# Patient Record
Sex: Male | Born: 1960 | Race: Black or African American | Hispanic: No | Marital: Married | Smoking: Former smoker
Health system: Southern US, Community
[De-identification: ages and names within clinical notes are randomized; demographics above are authoritative.]

## PROBLEM LIST (undated history)

## (undated) DIAGNOSIS — I2699 Other pulmonary embolism without acute cor pulmonale: Secondary | ICD-10-CM

## (undated) DIAGNOSIS — F909 Attention-deficit hyperactivity disorder, unspecified type: Secondary | ICD-10-CM

## (undated) DIAGNOSIS — J189 Pneumonia, unspecified organism: Secondary | ICD-10-CM

## (undated) DIAGNOSIS — K219 Gastro-esophageal reflux disease without esophagitis: Secondary | ICD-10-CM

## (undated) DIAGNOSIS — I10 Essential (primary) hypertension: Secondary | ICD-10-CM

## (undated) HISTORY — PX: CHEST TUBE INSERTION: SHX231

---

## 2007-05-11 ENCOUNTER — Emergency Department (HOSPITAL_COMMUNITY): Admission: EM | Admit: 2007-05-11 | Discharge: 2007-05-11 | Payer: Self-pay | Admitting: Emergency Medicine

## 2008-03-12 ENCOUNTER — Ambulatory Visit: Payer: Self-pay | Admitting: Radiology

## 2008-03-12 ENCOUNTER — Emergency Department (HOSPITAL_BASED_OUTPATIENT_CLINIC_OR_DEPARTMENT_OTHER): Admission: EM | Admit: 2008-03-12 | Discharge: 2008-03-12 | Payer: Self-pay | Admitting: Emergency Medicine

## 2008-04-25 ENCOUNTER — Encounter: Payer: Self-pay | Admitting: Emergency Medicine

## 2008-04-25 ENCOUNTER — Observation Stay (HOSPITAL_COMMUNITY): Admission: EM | Admit: 2008-04-25 | Discharge: 2008-04-27 | Payer: Self-pay | Admitting: Cardiovascular Disease

## 2008-04-25 ENCOUNTER — Ambulatory Visit: Payer: Self-pay | Admitting: Diagnostic Radiology

## 2009-02-18 ENCOUNTER — Ambulatory Visit: Payer: Self-pay | Admitting: Diagnostic Radiology

## 2009-02-18 ENCOUNTER — Emergency Department (HOSPITAL_BASED_OUTPATIENT_CLINIC_OR_DEPARTMENT_OTHER): Admission: EM | Admit: 2009-02-18 | Discharge: 2009-02-18 | Payer: Self-pay | Admitting: Emergency Medicine

## 2010-04-25 LAB — BASIC METABOLIC PANEL
BUN: 8 mg/dL (ref 6–23)
BUN: 9 mg/dL (ref 6–23)
CO2: 26 mEq/L (ref 19–32)
Calcium: 8.8 mg/dL (ref 8.4–10.5)
Calcium: 8.9 mg/dL (ref 8.4–10.5)
Chloride: 106 mEq/L (ref 96–112)
Chloride: 108 mEq/L (ref 96–112)
GFR calc Af Amer: >60 mL/min (ref >60)
GFR calc non Af Amer: >60 mL/min (ref >60)
Sodium: 143 mEq/L (ref 135–145)

## 2010-04-25 LAB — CBC
Hemoglobin: 10.6 g/dL — ABNORMAL LOW (ref 13.0–17.0)
Hemoglobin: 11.5 g/dL — ABNORMAL LOW (ref 13.0–17.0)
MCHC: 33.5 g/dL (ref 30.0–36.0)
MCHC: 33.8 g/dL (ref 30.0–36.0)
MCV: 101.5 fL — ABNORMAL HIGH (ref 78.0–100.0)
MCV: 101.5 fL — ABNORMAL HIGH (ref 78.0–100.0)
MCV: 102.3 fL — ABNORMAL HIGH (ref 78.0–100.0)
RBC: 3.1 MIL/uL — ABNORMAL LOW (ref 4.22–5.81)
RBC: 3.17 MIL/uL — ABNORMAL LOW (ref 4.22–5.81)
RBC: 3.35 MIL/uL — ABNORMAL LOW (ref 4.22–5.81)
RDW: 13.5 % (ref 11.5–15.5)

## 2010-04-25 LAB — D-DIMER, QUANTITATIVE: D-Dimer, Quant: <0.22 ug/mL-FEU (ref 0.00–0.48)

## 2010-04-25 LAB — CROSSMATCH

## 2010-04-25 LAB — LIPID PANEL
Cholesterol: 144 mg/dL (ref 0–200)
HDL: 30 mg/dL — ABNORMAL LOW (ref >39)
LDL Cholesterol: 106 mg/dL — ABNORMAL HIGH (ref 0–99)
Total CHOL/HDL Ratio: 4.8 RATIO
Triglycerides: 39 mg/dL (ref <150)
VLDL: 8 mg/dL (ref 0–40)

## 2010-04-25 LAB — CARDIAC PANEL(CRET KIN+CKTOT+MB+TROPI)
CK, MB: 1.7 ng/mL (ref 0.3–4.0)
CK, MB: 1.7 ng/mL (ref 0.3–4.0)
CK, MB: 2.1 ng/mL (ref 0.3–4.0)
Total CK: 257 U/L — ABNORMAL HIGH (ref 7–232)
Troponin I: <0.01 ng/mL (ref 0.00–0.06)

## 2010-04-25 LAB — PROTIME-INR
INR: 3 — ABNORMAL HIGH (ref 0.00–1.49)
Prothrombin Time: 28.9 seconds — ABNORMAL HIGH (ref 11.6–15.2)
Prothrombin Time: 33.3 seconds — ABNORMAL HIGH (ref 11.6–15.2)

## 2010-04-25 LAB — POCT CARDIAC MARKERS
CKMB, poc: 1.4 ng/mL (ref 1.0–8.0)
Troponin i, poc: <0.05 ng/mL (ref 0.00–0.09)

## 2010-05-01 LAB — CBC
Hemoglobin: 14.2 g/dL (ref 13.0–17.0)
MCHC: 33.2 g/dL (ref 30.0–36.0)
RDW: 12.7 % (ref 11.5–15.5)

## 2010-05-01 LAB — URINALYSIS, ROUTINE W REFLEX MICROSCOPIC
Hgb urine dipstick: NEGATIVE
Nitrite: NEGATIVE
Protein, ur: NEGATIVE mg/dL
Specific Gravity, Urine: 1.01 (ref 1.005–1.030)
Urobilinogen, UA: 1 mg/dL (ref 0.0–1.0)

## 2010-05-01 LAB — COMPREHENSIVE METABOLIC PANEL
ALT: 14 U/L (ref 0–53)
Calcium: 9.7 mg/dL (ref 8.4–10.5)
Creatinine, Ser: 0.9 mg/dL (ref 0.4–1.5)
GFR calc Af Amer: >60 mL/min (ref >60)
Glucose, Bld: 118 mg/dL — ABNORMAL HIGH (ref 70–99)
Sodium: 144 mEq/L (ref 135–145)
Total Protein: 9.7 g/dL — ABNORMAL HIGH (ref 6.0–8.3)

## 2010-05-01 LAB — DIFFERENTIAL
Eosinophils Absolute: 0.1 10*3/uL (ref 0.0–0.7)
Lymphocytes Relative: 5 % — ABNORMAL LOW (ref 12–46)
Lymphs Abs: 0.5 10*3/uL — ABNORMAL LOW (ref 0.7–4.0)
Monocytes Relative: 3 % (ref 3–12)
Neutrophils Relative %: 90 % — ABNORMAL HIGH (ref 43–77)

## 2010-05-01 LAB — PROTIME-INR
INR: 1.8 — ABNORMAL HIGH (ref 0.00–1.49)
Prothrombin Time: 21.5 seconds — ABNORMAL HIGH (ref 11.6–15.2)

## 2010-05-29 NOTE — Discharge Summary (Signed)
[name], Bailey NO.:  [id number]   MEDICAL RECORD NO.:  [id number]          PATIENT TYPE:  INP   LOCATION:  3743                         FACILITY:  [facility]   PHYSICIAN:  Thereasa Solo. Little, M.D. DATE OF BIRTH:  01-Jun-1960   DATE OF ADMISSION:  04/25/2008  DATE OF DISCHARGE:  04/27/2008                               DISCHARGE SUMMARY   DISCHARGE DIAGNOSES:  1. Chest pain, probably pericarditis.  2. Status post cardiac CT angio with no evidence of coronary artery      disease.  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  3. 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  4. History of lupus anticoagulant.  5. 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  6. Dyslipidemia.  7. History of gastrointestinal bleed.  8. History of inferior vena cava filter.  9. History of pneumothorax in 1990.  10.History of surgical repair of his blebs.  11.Hypertension.  12.Hyperlipidemia.  13.Sinus bradycardia, rate here while sleeping is 32-37, the patient      is asymptomatic.  When he gets up and walks in the hall, it does      increase to 60.  14.Anemia, worked up in [employer] by his hematologist, Dr. Allyne Gee.   LABORATORY DATA:  INR on April 27, 2008, was 3.0; INR on April 26, 2008,  was 2.2; and INR on April 25, 2008, was 2.5.  Cardiac marker x1 is  troponin less than 0.05.  CK-MB 1.4.  CK-MB:  1. 294/2.1, troponin of less than 0.01.  2. 281/1.7, troponin of less than 0.01.  3. 257/1.7, troponin of less than 0.01.  4. 281/1.7, troponin of less than 0.01.   TSH 1.125.  Hemoglobin A1c 5.1.  Total cholesterol 144, triglycerides  39, HDL 30, and LDL was 106.  Magnesium was 1.9.  D-dimer was less than  0.22.  Hemoglobin 10.6, hematocrit 31.4, WBC 6.8, and platelets 245.  Sodium 141, potassium 3.9, BUN 8, creatinine 0.84.   X-rays, April 25, 2008:  Chest x-ray shows borderline cardiomegaly, no  acute abnormality.  CT of his chest, I do not have  the full report, but  the radiologist's read states emphysema with bullous changes, no left  hemothorax, bilateral lower lobe atelectasis and/or scarring.  Preliminary report read by Dr. Mariah Milling is that he has calcium score of 0,  no significant CAD.  He does have atherosclerosis in his descending  aorta.   HOSPITAL COURSE:  [name] is a 50 year old African American  who received his care at [facility] in [facility].  His primary is Dr.  Almedia Balls.  His hematologist is Dr. Ivor Reining.  He went to Med  [facility] in [facility] because of chest pain.  He had stated that it  started on Saturday while attending an anniversary celebration for his  parents.  He then had a fleeting left anterior chest pain, sharp  intermittently during the afternoon.  He had no associated shortness of  breath, nausea, vomiting, or diaphoresis.  He had a few pains on Sunday,  but not severe.  They were more fleeting in  the night on admission,  April 25, 2008.  He went outside on his porch, he worked on his rowing  machine for 10-15 minutes, and he smoked a cigarette, and began to  experience chest pain.  He went to the [facility] at [facility].  He had  a sinus brady with repol abnormality and he was brought to [facility].  His troponin had by that time negative x2.  He was admitted.  We  continued his Coumadin.  His INR was therapeutic.  It was decided that  he should undergo cardiac CT angio.  This was performed.  Dr. Mariah Milling did  the Cardiology read.  His calcium score was negative.  He had no  significant CAD.  He did have atherosclerosis in his descending aorta.  He had emphysema of his lungs.  He was then to be discharged later in  the day, but he continued to have pain.  He was treated initially with  morphine and then we tried Toradol and Solu-Medrol.  These did not seem  to be effective and on April 26, 2008, we did place him on indomethacin  25 mg t.i.d.  He did receive morphine in the night  on April 26, 2008,  but then by April 27, 2008, his last morphine was at 4:30 a.m. and at  the time of discharge on 3 o'clock he had been pain free.  He was seen  by Dr. Clarene Duke.  His blood pressure was 118/80.  His heart rate was slow  at 32-36.  He got up and walked in the hall, and his heart rate  appropriately went up in the 60s.  He was afebrile, temperature 97.4.  His INR was 3.0.  His O2 sats were 97% on room air.  During his  hospitalization, his blood pressure was not low.  We did discontinue his  hydralazine, and we cutback on his Norvasc.  He was told to follow up  with Dr. Mariah Milling in about 1 week's time and he should follow up with his  primary care doctor.  He was asked to have his protime checked on  Friday.  Because his INR jumped from 2.2 to 3.0 with the addition of  indomethacin, he was asked just to take his warfarin 5 mg on Wednesday  night and Thursday night, and have his INR checked on Friday.  He had  been receiving his usual dose in the hospital, which was 10 mg every day  except 5 mg on Wednesday.  Current discharge medications are benazepril  20 mg a day, amlodipine 5 mg a day, warfarin 5 mg Wednesday and  Thursday.  Have his protime checked on Friday.  Protonix 40 mg twice a  day, indomethacin 25 mg two times per day with food x10 days, hold his  aspirin for now.  He may use nicotine patches to quit smoking.  Simvastatin 40 mg daily at bedtime.      Lezlie Octave, N.P.    ______________________________  Thereasa Solo. Little, M.D.    BB/MEDQ  D:  04/27/2008  T:  04/28/2008  Job:  [id number]   cc:   Zenaida Deed MD Allyne Gee

## 2010-05-29 NOTE — Discharge Summary (Signed)
NAMEKIRIN, PASTORINO NO.:  [id number]   MEDICAL RECORD NO.:  [id number]          PATIENT TYPE:  EMS   LOCATION:  ED                            FACILITY:  [facility]   PHYSICIAN:  Antonieta Iba, MD   DATE OF BIRTH:  Jun 20, 1960   DATE OF ADMISSION:  04/25/2008  DATE OF DISCHARGE:  04/25/2008                               DISCHARGE SUMMARY   DISCHARGE DIAGNOSES:  1. Chest pain, probably noncardiac.  2. History of lupus anticoagulant and pulmonary embolism in the past      status post [place] filter and on chronic Coumadin therapy,      negative D-dimer this admission.  3. Treated hypertension.  4. Treated dyslipidemia.   HOSPITAL COURSE:  The patient is a 50 year old African American male who  was transferred from [facility] in [facility] where he presented  with chest pain.  He says his symptoms started Saturday prior to  admission.  He developed some left upper anterior chest pain which was  intermittent.  He admits to having an upper respiratory infection a  couple of weeks ago with some fairly severe bouts of coughing, he  relates this to pollen allergy.  He was transferred from [facility]  to [facility].  The patient has been on Coumadin although he admits that about  a week or so ago he stopped taking it but has resumed it.  His INR was  therapeutic on admission.  His D-dimer was negative.  He is admitted to  telemetry.  CK-MB and troponins were obtained and were negative.  He was  continued on Coumadin.  He underwent a cardiac CT scan which was  negative for significant coronary artery disease per Dr. Mariah Milling.  There  was evidence of COPD on CT scan.  The patient was seen by a tobacco  cessation counselor.  We feel he can be discharged later on April 25, 2008.  We will go ahead and give him a short course of nonsteroidal anti-  inflammatories for presumed musculoskeletal chest pain.   DISCHARGE MEDICATIONS:  1. Celebrex 200 mg b.i.d. for 5 days.  2.  Coumadin 5 mg Wednesday and 10 mg other days.  3. Lotrel 10/20 daily.  4. Hydralazine 50 mg t.i.d. as taken at home.  5. Simvastatin 40 mg a day.  6. Protonix 40 mg a day.  7. Aspirin 325 mg a day.  8. Nicotine patch 21 mg to start as directed.   LABORATORY DATA:  White count 5.6, hemoglobin 10.9, hematocrit 32.5,  platelets 264.  Sodium 141, potassium 3.9, BUN 8, creatinine 0.84,  hemoglobin A1c is 5.1.  CKs were elevated at 294 and 281 but MB and  troponins were negative.  Lipid panel shows a cholesterol of 144,  triglycerides of 39, HDL 30, LDL 106.  TSH 1.12.  Blood type is O+.  Cardiac CT initial reading shows emphysema with bullous changes in the  left hemithorax with  bilateral lower lobe atelectasis and scarring, followup CT reading per  Dr. Mariah Milling showed no significant coronary artery disease.   DISPOSITION:  The patient is  discharged in stable condition.  He will  follow up with Dr. Tresa Endo in [facility].      Abelino Derrick, P.A.      Antonieta Iba, MD  Electronically Signed    LKK/MEDQ  D:  04/25/2008  T:  04/26/2008  Job:  [id number]   cc:   Shaune Spittle, MD

## 2010-06-28 ENCOUNTER — Emergency Department (HOSPITAL_BASED_OUTPATIENT_CLINIC_OR_DEPARTMENT_OTHER)
Admission: EM | Admit: 2010-06-28 | Discharge: 2010-06-28 | Disposition: A | Discharge status: Other Acute Inpatient Hospital | Payer: 59 | Attending: Emergency Medicine | Admitting: Emergency Medicine

## 2010-06-28 ENCOUNTER — Emergency Department (INDEPENDENT_AMBULATORY_CARE_PROVIDER_SITE_OTHER): Payer: 59

## 2010-06-28 DIAGNOSIS — I517 Cardiomegaly: Secondary | ICD-10-CM

## 2010-06-28 DIAGNOSIS — Z79899 Other long term (current) drug therapy: Secondary | ICD-10-CM | POA: Insufficient documentation

## 2010-06-28 DIAGNOSIS — I10 Essential (primary) hypertension: Secondary | ICD-10-CM | POA: Insufficient documentation

## 2010-06-28 DIAGNOSIS — M25519 Pain in unspecified shoulder: Secondary | ICD-10-CM

## 2010-06-28 DIAGNOSIS — F17200 Nicotine dependence, unspecified, uncomplicated: Secondary | ICD-10-CM | POA: Insufficient documentation

## 2010-06-28 DIAGNOSIS — R079 Chest pain, unspecified: Secondary | ICD-10-CM | POA: Insufficient documentation

## 2010-06-28 DIAGNOSIS — R0602 Shortness of breath: Secondary | ICD-10-CM

## 2010-06-28 DIAGNOSIS — E7800 Pure hypercholesterolemia, unspecified: Secondary | ICD-10-CM | POA: Insufficient documentation

## 2010-06-28 LAB — DIFFERENTIAL
Basophils Absolute: 0 10*3/uL (ref 0.0–0.1)
Basophils Relative: 0 % (ref 0–1)
Eosinophils Absolute: 0.1 10*3/uL (ref 0.0–0.7)
Eosinophils Relative: 1 % (ref 0–5)
Monocytes Absolute: 0.6 10*3/uL (ref 0.1–1.0)
Monocytes Relative: 6 % (ref 3–12)

## 2010-06-28 LAB — URINALYSIS, ROUTINE W REFLEX MICROSCOPIC
Bilirubin Urine: NEGATIVE
Glucose, UA: NEGATIVE mg/dL
Hgb urine dipstick: NEGATIVE
Specific Gravity, Urine: 1.011 (ref 1.005–1.030)
Urobilinogen, UA: 1 mg/dL (ref 0.0–1.0)

## 2010-06-28 LAB — CBC
MCH: 33.2 pg (ref 26.0–34.0)
MCHC: 33.5 g/dL (ref 30.0–36.0)
RDW: 12.2 % (ref 11.5–15.5)

## 2010-06-28 LAB — BASIC METABOLIC PANEL
BUN: 22 mg/dL (ref 6–23)
Calcium: 9.4 mg/dL (ref 8.4–10.5)
GFR calc Af Amer: >60 mL/min (ref >60)
GFR calc non Af Amer: >60 mL/min (ref >60)
Glucose, Bld: 108 mg/dL — ABNORMAL HIGH (ref 70–99)
Potassium: 3.7 mEq/L (ref 3.5–5.1)
Sodium: 136 mEq/L (ref 135–145)

## 2010-06-28 LAB — PROTIME-INR
INR: 2.09 — ABNORMAL HIGH (ref 0.00–1.49)
Prothrombin Time: 23.8 seconds — ABNORMAL HIGH (ref 11.6–15.2)

## 2010-06-28 LAB — CK TOTAL AND CKMB (NOT AT ARMC)
CK, MB: 2.8 ng/mL (ref 0.3–4.0)
Relative Index: 0.9 (ref 0.0–2.5)

## 2010-10-09 LAB — POCT I-STAT, CHEM 8
BUN: 13
Calcium, Ion: 1.11 — ABNORMAL LOW
Chloride: 106
Creatinine, Ser: 1.2
Glucose, Bld: 99
TCO2: 25

## 2010-10-09 LAB — CBC
HCT: 35.6 — ABNORMAL LOW
MCHC: 33.7
MCV: 100.4 — ABNORMAL HIGH
RBC: 3.55 — ABNORMAL LOW
WBC: 7.1

## 2010-10-09 LAB — DIFFERENTIAL
Basophils Relative: 1
Eosinophils Absolute: 0.2
Eosinophils Relative: 3
Lymphs Abs: 3
Monocytes Absolute: 0.4
Monocytes Relative: 6

## 2013-05-24 ENCOUNTER — Encounter (HOSPITAL_BASED_OUTPATIENT_CLINIC_OR_DEPARTMENT_OTHER): Payer: Self-pay | Admitting: Emergency Medicine

## 2013-05-24 DIAGNOSIS — R079 Chest pain, unspecified: Secondary | ICD-10-CM | POA: Insufficient documentation

## 2013-05-24 NOTE — ED Notes (Signed)
Pt reports L sided chest pain today intermittently.  Denies sob, nausea or sweating but states that the last episode 'took my breath away.'  Reports 'feels like i pulled a muscle but i keep having some sharp stabbing pains.

## 2013-05-25 ENCOUNTER — Emergency Department (HOSPITAL_BASED_OUTPATIENT_CLINIC_OR_DEPARTMENT_OTHER)
Admission: EM | Admit: 2013-05-25 | Discharge: 2013-05-25 | Discharge status: Against Medical Advice | Payer: 59 | Attending: Emergency Medicine | Admitting: Emergency Medicine

## 2013-05-25 HISTORY — DX: Attention-deficit hyperactivity disorder, unspecified type: F90.9

## 2013-05-25 HISTORY — DX: Essential (primary) hypertension: I10

## 2013-05-25 HISTORY — DX: Gastro-esophageal reflux disease without esophagitis: K21.9

## 2013-05-25 NOTE — ED Notes (Signed)
rec'd call from registration stating that pt had LWBS

## 2018-07-16 ENCOUNTER — Emergency Department (HOSPITAL_BASED_OUTPATIENT_CLINIC_OR_DEPARTMENT_OTHER)
Admission: EM | Admit: 2018-07-16 | Discharge: 2018-07-16 | Disposition: A | Discharge status: Home / Self Care | Payer: Self-pay | Attending: Emergency Medicine | Admitting: Emergency Medicine

## 2018-07-16 ENCOUNTER — Emergency Department (HOSPITAL_BASED_OUTPATIENT_CLINIC_OR_DEPARTMENT_OTHER): Payer: Self-pay

## 2018-07-16 ENCOUNTER — Encounter (HOSPITAL_BASED_OUTPATIENT_CLINIC_OR_DEPARTMENT_OTHER): Payer: Self-pay

## 2018-07-16 ENCOUNTER — Other Ambulatory Visit: Payer: Self-pay

## 2018-07-16 DIAGNOSIS — Z79899 Other long term (current) drug therapy: Secondary | ICD-10-CM | POA: Insufficient documentation

## 2018-07-16 DIAGNOSIS — I10 Essential (primary) hypertension: Secondary | ICD-10-CM | POA: Insufficient documentation

## 2018-07-16 DIAGNOSIS — G8929 Other chronic pain: Secondary | ICD-10-CM

## 2018-07-16 DIAGNOSIS — Y9389 Activity, other specified: Secondary | ICD-10-CM | POA: Insufficient documentation

## 2018-07-16 DIAGNOSIS — F17210 Nicotine dependence, cigarettes, uncomplicated: Secondary | ICD-10-CM | POA: Insufficient documentation

## 2018-07-16 DIAGNOSIS — M7021 Olecranon bursitis, right elbow: Secondary | ICD-10-CM | POA: Insufficient documentation

## 2018-07-16 DIAGNOSIS — F909 Attention-deficit hyperactivity disorder, unspecified type: Secondary | ICD-10-CM | POA: Insufficient documentation

## 2018-07-16 DIAGNOSIS — M25511 Pain in right shoulder: Secondary | ICD-10-CM | POA: Insufficient documentation

## 2018-07-16 HISTORY — DX: Pneumonia, unspecified organism: J18.9

## 2018-07-16 HISTORY — DX: Other pulmonary embolism without acute cor pulmonale: I26.99

## 2018-07-16 LAB — BASIC METABOLIC PANEL
Anion gap: 8 (ref 5–15)
BUN: 8 mg/dL (ref 6–20)
CO2: 27 mmol/L (ref 22–32)
Calcium: 9.3 mg/dL (ref 8.9–10.3)
Chloride: 106 mmol/L (ref 98–111)
Creatinine, Ser: 0.88 mg/dL (ref 0.61–1.24)
GFR calc Af Amer: >60 mL/min (ref >60)
GFR calc non Af Amer: >60 mL/min (ref >60)
Glucose, Bld: 95 mg/dL (ref 70–99)
Potassium: 4 mmol/L (ref 3.5–5.1)
Sodium: 141 mmol/L (ref 135–145)

## 2018-07-16 MED ORDER — METHOCARBAMOL 500 MG PO TABS: 500.0000 mg | ORAL_TABLET | Freq: Two times a day (BID) | ORAL | 0 refills | Status: DC

## 2018-07-16 MED ORDER — NAPROXEN 500 MG PO TABS: 500.0000 mg | ORAL_TABLET | Freq: Two times a day (BID) | ORAL | 0 refills | Status: DC

## 2018-07-16 MED FILL — METHOCARBAMOL 500 MG TABLET: 500 | 10 days supply | Qty: 20 | Fill #0

## 2018-07-16 MED FILL — NAPROXEN 500 MG TABLET: 500 | 5 days supply | Qty: 10 | Fill #0

## 2018-07-16 NOTE — ED Provider Notes (Signed)
[facility] EMERGENCY DEPARTMENT Provider Note   CSN: [id number] Arrival date & time: 07/16/18  1225    History   Chief Complaint Chief Complaint  Patient presents with  . Joint Swelling    HPI Darrell Bailey is a 58 y.o. male who presents for evaluation of two complaints. He reports that for about a week he has had some swelling to the right lateral elbow. He has noted any overlying warmth, erythema. He denies any preceding trauma, injury or fall. He denies any overuse injury but does state that he was out pushing the lawnmower the day prior to onset of symptoms.  He has not been taking any medications or tried any treatments at home for symptoms.  He does report that he feels like the swelling is getting bigger.  He has not had any decreased range of motion, fevers, chills, numbness/weakness.  Additionally, he also comes in for evaluation of right shoulder pain that is been ongoing for a while.  He states he has had issues with his right shoulder for last 2 years.  He has not sought evaluation for it.  He feels like in the last month or so, the pain was becoming more bothersome which was affecting the range of motion of his right shoulder.  Patient denies any preceding trauma, injury, fall.  He has not noticed any overlying warmth, erythema, edema.  He has not noted any fevers or chills.  He states that the pain is worse when he attempts to raise his arm up over his head.     The history is provided by the patient.    Past Medical History:  Diagnosis Date  . ADHD (attention deficit hyperactivity disorder)   . GERD (gastroesophageal reflux disease)   . Hypertension   . Pneumonia   . Pulmonary embolism (HCC)     There are no active problems to display for this patient.   Past Surgical History:  Procedure Laterality Date  . CHEST TUBE INSERTION          Home Medications    Prior to Admission medications   Medication Sig Start Date End Date Taking? Authorizing  Provider  amLODipine-benazepril (LOTREL) 10-20 MG per capsule Take 1 capsule by mouth daily.    [provider]  lisdexamfetamine (VYVANSE) 60 MG capsule Take 60 mg by mouth every morning.    [provider]  methocarbamol (ROBAXIN) 500 MG tablet Take 1 tablet (500 mg total) by mouth 2 (two) times daily. 07/16/18   Maxwell CaulLayden,  A, PA-C  naproxen (NAPROSYN) 500 MG tablet Take 1 tablet (500 mg total) by mouth 2 (two) times daily. 07/16/18   Maxwell CaulLayden,  A, PA-C  pantoprazole (PROTONIX) 40 MG tablet Take 40 mg by mouth daily.    [provider]    Family History No family history on file.  Social History Social History   Tobacco Use  . Smoking status: Current Every Day Smoker    Packs/day: 0.50    Types: Cigarettes  . Smokeless tobacco: Never Used  Substance Use Topics  . Alcohol use: Yes    Comment: occ  . Drug use: Yes    Types: Marijuana     Allergies   Patient has no known allergies.   Review of Systems Review of Systems  Constitutional: Negative for fever.  Gastrointestinal: Negative for abdominal pain, nausea and vomiting.  Musculoskeletal: Positive for joint swelling.       Right elbow pain Right shoulder pain  Skin: Negative  for color change.  Neurological: Negative for weakness, numbness and headaches.  All other systems reviewed and are negative.    Physical Exam Updated Vital Signs BP (!) 173/99 (BP Location: Right Arm)   Pulse (!) 50   Temp 99.1 F (37.3 C) (Oral)   Resp 18   Ht 6\' 1"  (1.854 m)   Wt 82.1 kg   SpO2 100%   BMI 23.88 kg/m   Physical Exam Vitals signs and nursing note reviewed.  Constitutional:      Appearance: He is well-developed.  HENT:     Head: Normocephalic and atraumatic.  Eyes:     General: No scleral icterus.       Right eye: No discharge.        Left eye: No discharge.     Conjunctiva/sclera: Conjunctivae normal.  Neck:     Comments: Positive Spurling's maneuver Cardiovascular:      Pulses:          Radial pulses are 2+ on the right side and 2+ on the left side.  Pulmonary:     Effort: Pulmonary effort is normal.  Musculoskeletal:     Right elbow: He exhibits swelling.     Comments: Swelling noted to the posterior aspect of the right elbow at the olecranon.  No overlying warmth, erythema.  Lection/tension of right elbow intact with any difficulty.  Flexion/tension of right wrist intact without any difficulty.  Diffuse tenderness palpation noted to right shoulder.  No deformity or crepitus noted.  No overlying warmth, erythema, edema.  Limited range of motion secondary to pain.  He can achieve about 110 degrees of flexion before experiencing pain.  Additionally, he can achieve about 100 degrees of abduction before experiencing pain.  Positive Neer's impingement, Hawkins, liftoff, empty can test.  No tenderness palpation of the left shoulder, left elbow, left wrist.  Full range of motion of left upper extremity without any difficulty.  Negative Neer's impingement, Hawkins, liftoff, empty can test.  Skin:    General: Skin is warm and dry.     Capillary Refill: Capillary refill takes less than 2 seconds.     Comments: Good distal cap refill. RUE is not dusky in appearance or cool to touch.  Neurological:     Mental Status: He is alert.     Comments: Sensation intact along major nerve distributions of BUE Equal grip strength bilatearlly  Psychiatric:        Speech: Speech normal.        Behavior: Behavior normal.      FROM of wrist, elbow Decreased ROM abduction and flex/extend of right shoulder Good 2+ radial pulses    ED Treatments / Results  Labs (all labs ordered are listed, but only abnormal results are displayed) Labs Reviewed  BASIC METABOLIC PANEL    EKG None  Radiology Dg Shoulder Right  Result Date: 07/16/2018 CLINICAL DATA:  Right shoulder pain for several months. EXAM: RIGHT SHOULDER - 2+ VIEW COMPARISON:  None. FINDINGS: There is no evidence of  fracture or dislocation. There is no evidence of arthropathy or other focal bone abnormality. Soft tissues are unremarkable. IMPRESSION: Negative. Electronically Signed   By: Lupita RaiderJames  Green Jr M.D.   On: 07/16/2018 13:54   Dg Elbow Complete Right  Result Date: 07/16/2018 CLINICAL DATA:  Acute right elbow pain and swelling. EXAM: RIGHT ELBOW - COMPLETE 3+ VIEW COMPARISON:  None. FINDINGS: There is no evidence of fracture, dislocation, or joint effusion. There is no evidence of arthropathy or  other focal bone abnormality. Soft tissue swelling is seen overlying the olecranon. IMPRESSION: No fracture or dislocation is noted. Soft tissue swelling is seen overlying the olecranon concerning for soft tissue infection or injury. Electronically Signed   By: Marijo Conception M.D.   On: 07/16/2018 13:56    Procedures Procedures (including critical care time)  Medications Ordered in ED Medications - No data to display   Initial Impression / Assessment and Plan / ED Course  I have reviewed the triage vital signs and the nursing notes.  Pertinent labs & imaging results that were available during my care of the patient were reviewed by me and considered in my medical decision making (see chart for details).  Clinical Course as of Jul 15 2049  Thu Jul 16, 2018  1413 DG Elbow Complete Right [ET]  1413 DG Elbow Complete Right [ET]    Clinical Course User Index [ET] Kandice Robinsons       58 year old male who presents for evaluation of right elbow swelling x1 week.  No overlying warmth, erythema.  Also endorses shoulder pain that is been ongoing for the last several years but worse in the last month.  No fevers. Patient is afebrile, non-toxic appearing, sitting comfortably on examination table. Vital signs reviewed and stable. Patient is neurovascularly intact.  On exam, he has swelling over the right olecranon with no overlying warmth, erythema.  Consistent with olecranon bursitis. Also  consider lipoma. History/physical exam not concerning for septic joint, DVT of upper extremity, acute arterial embolism/ischemic limb.  He has no overlying warmth or erythema that would be concerning for infectious etiology.  Additionally, right shoulder is without any deformity, any infectious signs.  Concern for cervical radiculopathy versus rotator cuff impingement.  Will plan for x-rays for evaluation of any acute bony abnormality.  Shoulder x-ray negative for any acute bony abnormality.  X-ray of elbow shows soft tissue swelling but no otherwise no acute bony abnormality.  Discussed results with patient.  I discussed treatment options here in the ED, including but not limited to conservative treatment as well as draining of olecranon bursa to help with symptomatic relief.  Patient declines wanting draining of olecranon bursa at this time.  I did discuss risk first benefits and he expresses understanding.  He wishes to decline and wants to try conservative treatment first.  Patient has not seen any outpatient orthopedic.  We will plan to provide him with outpatient Ortho referral.  Additionally, will plan to treat with conservative treatment, NSAID therapy.  BMP checked here in the ED which shows normal BUN and creatinine. At this time, patient exhibits no emergent life-threatening condition that require further evaluation in ED or admission. Patient had ample opportunity for questions and discussion. All patient's questions were answered with full understanding. Strict return precautions discussed. Patient expresses understanding and agreement to plan.   Portions of this note were generated with [employer]. Dictation errors may occur despite best attempts at proofreading.   Final Clinical Impressions(s) / ED Diagnoses   Final diagnoses:  Olecranon bursitis of right elbow  Chronic right shoulder pain    ED Discharge Orders         Ordered    methocarbamol (ROBAXIN) 500 MG tablet   2 times daily     07/16/18 1524    naproxen (NAPROSYN) 500 MG tablet  2 times daily     07/16/18 1524           , Tyna Jaksch, PA-C  07/16/18 2051    Melene PlanFloyd, Dan, DO 07/17/18 [id number]

## 2018-07-16 NOTE — ED Triage Notes (Signed)
C/o swelling to rigth elbow x 1 week-denies injury-also c/o pain to right shoulder x "years"-NAD-steady gait

## 2018-07-16 NOTE — Discharge Instructions (Signed)
Take naproxen as directed.  As we discussed, you can apply ice to the area to help with pain and swelling.  Take Robaxin as prescribed. This medication will make you drowsy so do not drive or drink alcohol when taking it.  As we discussed, given shoulder complaints, please follow-up with referred orthopedic doctor.  I have also provided referral to [facility] for establishment of a primary care doctor.  Return to the emergency department for any fevers, redness or swelling of the elbow, difficulty moving the elbow, worsening pain or any other worsening or concerning symptoms.

## 2018-07-16 NOTE — ED Notes (Signed)
ED Provider at bedside. 

## 2019-06-10 ENCOUNTER — Other Ambulatory Visit: Payer: Self-pay

## 2019-06-10 ENCOUNTER — Emergency Department (HOSPITAL_COMMUNITY)
Admission: EM | Admit: 2019-06-10 | Discharge: 2019-06-11 | Disposition: A | Discharge status: Home / Self Care | Payer: Self-pay | Attending: Emergency Medicine | Admitting: Emergency Medicine

## 2019-06-10 ENCOUNTER — Encounter (HOSPITAL_COMMUNITY): Payer: Self-pay

## 2019-06-10 ENCOUNTER — Emergency Department (HOSPITAL_COMMUNITY): Payer: Self-pay

## 2019-06-10 DIAGNOSIS — N179 Acute kidney failure, unspecified: Secondary | ICD-10-CM | POA: Insufficient documentation

## 2019-06-10 DIAGNOSIS — R55 Syncope and collapse: Secondary | ICD-10-CM

## 2019-06-10 DIAGNOSIS — F17210 Nicotine dependence, cigarettes, uncomplicated: Secondary | ICD-10-CM | POA: Insufficient documentation

## 2019-06-10 DIAGNOSIS — Z86711 Personal history of pulmonary embolism: Secondary | ICD-10-CM | POA: Insufficient documentation

## 2019-06-10 DIAGNOSIS — E860 Dehydration: Secondary | ICD-10-CM | POA: Insufficient documentation

## 2019-06-10 LAB — BASIC METABOLIC PANEL
Anion gap: 11 (ref 5–15)
BUN: 16 mg/dL (ref 6–20)
CO2: 22 mmol/L (ref 22–32)
Calcium: 8.9 mg/dL (ref 8.9–10.3)
Chloride: 107 mmol/L (ref 98–111)
Creatinine, Ser: 1.62 mg/dL — ABNORMAL HIGH (ref 0.61–1.24)
GFR calc Af Amer: 53 mL/min — ABNORMAL LOW (ref >60)
GFR calc non Af Amer: 46 mL/min — ABNORMAL LOW (ref >60)
Glucose, Bld: 112 mg/dL — ABNORMAL HIGH (ref 70–99)
Potassium: 4.2 mmol/L (ref 3.5–5.1)
Sodium: 140 mmol/L (ref 135–145)

## 2019-06-10 LAB — CBC
HCT: 40.7 % (ref 39.0–52.0)
Hemoglobin: 13.2 g/dL (ref 13.0–17.0)
MCH: 32.8 pg (ref 26.0–34.0)
MCHC: 32.4 g/dL (ref 30.0–36.0)
MCV: 101.2 fL — ABNORMAL HIGH (ref 80.0–100.0)
Platelets: 231 10*3/uL (ref 150–400)
RBC: 4.02 MIL/uL — ABNORMAL LOW (ref 4.22–5.81)
RDW: 12.4 % (ref 11.5–15.5)
WBC: 5.8 10*3/uL (ref 4.0–10.5)
nRBC: 0 % (ref 0.0–0.2)

## 2019-06-10 LAB — CBG MONITORING, ED: Glucose-Capillary: 94 mg/dL (ref 70–99)

## 2019-06-10 MED ORDER — SODIUM CHLORIDE 0.9 % IV BOLUS
1000.0000 mL | Freq: Once | INTRAVENOUS | Status: AC
  Administered 2019-06-10: 1000 mL via INTRAVENOUS

## 2019-06-10 NOTE — ED Triage Notes (Signed)
Pt BIB [facility] for witnessed syncopal episode at family gathering. Upon EMS arrival, EMS noted pt to be pale/diaphoretic, hypotensive at 90s systolic. Pt given bolus, pressure after 104/68 manual. Pt A&O on arrival, NAD noted at this time, no other needs expressed.

## 2019-06-10 NOTE — ED Notes (Signed)
Patient verbalizes understanding of discharge instructions. Opportunity for questioning and answers were provided. Armband removed by staff, pt discharged from ED stable & ambulatory  

## 2019-06-10 NOTE — ED Provider Notes (Signed)
[facility] Emergency Department Provider Note MRN:  [id number]  Arrival date & time: 06/10/19     Chief Complaint   Loss of Consciousness   History of Present Illness   Darrell Bailey is a 59 y.o. year-old male with a history of pulmonary embolism, hypertension presenting to the ED with chief complaint of LOC.  Patient explains that he was at a family gathering and was drinking alcohol and out in the heat and he became lightheaded and then had a syncopal episode.  No preceding chest pain or shortness of breath, no chest pain or shortness of breath afterwards.  Quickly regained consciousness, here for evaluation.  No headache or vision change, no numbness or weakness, no abdominal pain, no recent illness.  Review of Systems  A complete 10 system review of systems was obtained and all systems are negative except as noted in the HPI and PMH.   Patient's Health History    Past Medical History:  Diagnosis Date  . ADHD (attention deficit hyperactivity disorder)   . GERD (gastroesophageal reflux disease)   . Hypertension   . Pneumonia   . Pulmonary embolism [facility])     Past Surgical History:  Procedure Laterality Date  . CHEST TUBE INSERTION      History reviewed. No pertinent family history.  Social History   Socioeconomic History  . Marital status: Married    Spouse name: Not on file  . Number of children: Not on file  . Years of education: Not on file  . Highest education level: Not on file  Occupational History  . Not on file  Tobacco Use  . Smoking status: Current Every Day Smoker    Packs/day: 0.50    Types: Cigarettes  . Smokeless tobacco: Never Used  Substance and Sexual Activity  . Alcohol use: Yes    Comment: occ  . Drug use: Yes    Types: Marijuana  . Sexual activity: Not on file  Other Topics Concern  . Not on file  Social History Narrative  . Not on file   Social Determinants of Health   Financial Resource Strain:   .  Difficulty of Paying Living Expenses:   Food Insecurity:   . Worried About Charity fundraiser in the Last Year:   . Arboriculturist in the Last Year:   Transportation Needs:   . Film/video editor (Medical):   Marland Kitchen Lack of Transportation (Non-Medical):   Physical Activity:   . Days of Exercise per Week:   . Minutes of Exercise per Session:   Stress:   . Feeling of Stress :   Social Connections:   . Frequency of Communication with Friends and Family:   . Frequency of Social Gatherings with Friends and Family:   . Attends Religious Services:   . Active Member of Clubs or Organizations:   . Attends Archivist Meetings:   Marland Kitchen Marital Status:   Intimate Partner Violence:   . Fear of Current or Ex-Partner:   . Emotionally Abused:   Marland Kitchen Physically Abused:   . Sexually Abused:      Physical Exam   Vitals:   06/10/19 2230 06/10/19 2300  BP: 126/84 127/85  Pulse: 69 (!) 56  Resp:  14  Temp:    SpO2: 100% 100%    CONSTITUTIONAL: Well-appearing, NAD NEURO:  Alert and oriented x 3, no focal deficits EYES:  eyes equal and reactive ENT/NECK:  no LAD, no JVD CARDIO: Regular rate,  well-perfused, normal S1 and S2 PULM:  CTAB no wheezing or rhonchi GI/GU:  normal bowel sounds, non-distended, non-tender MSK/SPINE:  No gross deformities, no edema SKIN:  no rash, atraumatic PSYCH:  Appropriate speech and behavior  *Additional and/or pertinent findings included in MDM below  Diagnostic and Interventional Summary    EKG Interpretation  Date/Time:  Thursday Jun 10 2019 20:18:14 EDT Ventricular Rate:  79 PR Interval:    QRS Duration: 104 QT Interval:  390 QTC Calculation: 448 R Axis:   79 Text Interpretation: Sinus rhythm Prolonged PR interval Left ventricular hypertrophy Confirmed by Kennis Carina [id number]) on 06/10/2019 8:22:30 PM      Labs Reviewed  BASIC METABOLIC PANEL - Abnormal; Notable for the following components:      Result Value   Glucose, Bld 112 (*)     Creatinine, Ser 1.62 (*)    GFR calc non Af Amer 46 (*)    GFR calc Af Amer 53 (*)    All other components within normal limits  CBC - Abnormal; Notable for the following components:   RBC 4.02 (*)    MCV 101.2 (*)    All other components within normal limits  URINALYSIS, ROUTINE W REFLEX MICROSCOPIC  CBG MONITORING, ED    CT HEAD WO CONTRAST  Final Result      Medications  sodium chloride 0.9 % bolus 1,000 mL (0 mLs Intravenous Stopped 06/10/19 2305)     Procedures  /  Critical Care Procedures  ED Course and Medical Decision Making  I have reviewed the triage vital signs, the nursing notes, and pertinent available records from the EMR.  Listed above are laboratory and imaging tests that I personally ordered, reviewed, and interpreted and then considered in my medical decision making (see below for details).      Syncopal episode that seems explained by heat exposure as well as dehydration and/or alcohol consumption.  Very well-appearing on exam with normal vital signs.  EKG is demonstrating some ST elevation though he has exhibited this morphology in the past and without chest pain there is very low concern for cardiac event.  Patient does have a documented history of pulmonary embolism, however without any chest pain or shortness of breath, no evidence of DVT, no clear explanation for patient's syncopal episode today, I do not think there is need for further diagnostics unless his clinical status were to change.  Will provide IV fluids, reassess.  Patient feeling better after fluids, making plenty of urine here in the emergency department, labs reveal AKI, though I feel patient can recover from this AKI adequately at home.  Appropriate for discharge with PCP follow-up.  Elmer Sow. Pilar Plate, MD [facility] Emergency Medicine [facility] [email or web address]  Final Clinical Impressions(s) / ED Diagnoses     ICD-10-CM   1. Dehydration  E86.0   2. Syncope,  unspecified syncope type  R55   3. AKI (acute kidney injury) (HCC)  N17.9     ED Discharge Orders    None       Discharge Instructions Discussed with and Provided to Patient:     Discharge Instructions     You were evaluated in the Emergency Department and after careful evaluation, we did not find any emergent condition requiring admission or further testing in the hospital.  Your exam/testing today was overall reassuring.  Your symptoms seem to be due to dehydration.  As discussed, please drink plenty of fluids over the next few days to recover.  Please return to the Emergency Department if you experience any worsening of your condition.  We encourage you to follow up with a primary care provider.  Thank you for allowing [place] to be a part of your care.        Sabas Sous, MD 06/10/19 2350

## 2019-06-10 NOTE — Discharge Instructions (Addendum)
You were evaluated in the Emergency Department and after careful evaluation, we did not find any emergent condition requiring admission or further testing in the hospital.  Your exam/testing today was overall reassuring.  Your symptoms seem to be due to dehydration.  As discussed, please drink plenty of fluids over the next few days to recover.  Please return to the Emergency Department if you experience any worsening of your condition.  We encourage you to follow up with a primary care provider.  Thank you for allowing [place] to be a part of your care.

## 2019-06-10 NOTE — ED Notes (Signed)
Pt updating family via phone in room.

## 2021-07-09 ENCOUNTER — Encounter: Payer: Self-pay | Admitting: Pain Medicine

## 2021-07-09 ENCOUNTER — Ambulatory Visit: Payer: No Typology Code available for payment source | Attending: Pain Medicine | Admitting: Pain Medicine

## 2021-07-09 VITALS — BP 135/88 | HR 81 | Temp 98.7°F | Resp 18 | Ht 76.0 in | Wt 190.0 lb

## 2021-07-09 DIAGNOSIS — M25511 Pain in right shoulder: Secondary | ICD-10-CM | POA: Insufficient documentation

## 2021-07-09 DIAGNOSIS — G4486 Cervicogenic headache: Secondary | ICD-10-CM

## 2021-07-09 DIAGNOSIS — I10 Essential (primary) hypertension: Secondary | ICD-10-CM | POA: Insufficient documentation

## 2021-07-09 DIAGNOSIS — R079 Chest pain, unspecified: Secondary | ICD-10-CM | POA: Insufficient documentation

## 2021-07-09 DIAGNOSIS — J449 Chronic obstructive pulmonary disease, unspecified: Secondary | ICD-10-CM | POA: Insufficient documentation

## 2021-07-09 DIAGNOSIS — G4700 Insomnia, unspecified: Secondary | ICD-10-CM | POA: Insufficient documentation

## 2021-07-09 DIAGNOSIS — M7918 Myalgia, other site: Secondary | ICD-10-CM | POA: Insufficient documentation

## 2021-07-09 DIAGNOSIS — F4321 Adjustment disorder with depressed mood: Secondary | ICD-10-CM | POA: Insufficient documentation

## 2021-07-09 DIAGNOSIS — Z79899 Other long term (current) drug therapy: Secondary | ICD-10-CM

## 2021-07-09 DIAGNOSIS — Z8709 Personal history of other diseases of the respiratory system: Secondary | ICD-10-CM | POA: Insufficient documentation

## 2021-07-09 DIAGNOSIS — G5601 Carpal tunnel syndrome, right upper limb: Secondary | ICD-10-CM | POA: Insufficient documentation

## 2021-07-09 DIAGNOSIS — M62838 Other muscle spasm: Secondary | ICD-10-CM | POA: Diagnosis present

## 2021-07-09 DIAGNOSIS — I2699 Other pulmonary embolism without acute cor pulmonale: Secondary | ICD-10-CM | POA: Insufficient documentation

## 2021-07-09 DIAGNOSIS — I517 Cardiomegaly: Secondary | ICD-10-CM | POA: Insufficient documentation

## 2021-07-09 DIAGNOSIS — Z9889 Other specified postprocedural states: Secondary | ICD-10-CM | POA: Diagnosis present

## 2021-07-09 DIAGNOSIS — Z4802 Encounter for removal of sutures: Secondary | ICD-10-CM | POA: Insufficient documentation

## 2021-07-09 DIAGNOSIS — M4802 Spinal stenosis, cervical region: Secondary | ICD-10-CM

## 2021-07-09 DIAGNOSIS — Z789 Other specified health status: Secondary | ICD-10-CM | POA: Diagnosis present

## 2021-07-09 DIAGNOSIS — M47812 Spondylosis without myelopathy or radiculopathy, cervical region: Secondary | ICD-10-CM

## 2021-07-09 DIAGNOSIS — R42 Dizziness and giddiness: Secondary | ICD-10-CM | POA: Insufficient documentation

## 2021-07-09 DIAGNOSIS — F32A Depression, unspecified: Secondary | ICD-10-CM | POA: Insufficient documentation

## 2021-07-09 DIAGNOSIS — R937 Abnormal findings on diagnostic imaging of other parts of musculoskeletal system: Secondary | ICD-10-CM | POA: Diagnosis present

## 2021-07-09 DIAGNOSIS — M5030 Other cervical disc degeneration, unspecified cervical region: Secondary | ICD-10-CM

## 2021-07-09 DIAGNOSIS — M542 Cervicalgia: Secondary | ICD-10-CM

## 2021-07-09 DIAGNOSIS — R45851 Suicidal ideations: Secondary | ICD-10-CM | POA: Insufficient documentation

## 2021-07-09 DIAGNOSIS — B351 Tinea unguium: Secondary | ICD-10-CM | POA: Insufficient documentation

## 2021-07-09 DIAGNOSIS — M7740 Metatarsalgia, unspecified foot: Secondary | ICD-10-CM | POA: Insufficient documentation

## 2021-07-09 DIAGNOSIS — Z7189 Other specified counseling: Secondary | ICD-10-CM | POA: Insufficient documentation

## 2021-07-09 DIAGNOSIS — Z23 Encounter for immunization: Secondary | ICD-10-CM | POA: Insufficient documentation

## 2021-07-09 DIAGNOSIS — G894 Chronic pain syndrome: Secondary | ICD-10-CM

## 2021-07-09 DIAGNOSIS — M479 Spondylosis, unspecified: Secondary | ICD-10-CM | POA: Diagnosis present

## 2021-07-09 DIAGNOSIS — M899 Disorder of bone, unspecified: Secondary | ICD-10-CM

## 2021-07-09 DIAGNOSIS — F339 Major depressive disorder, recurrent, unspecified: Secondary | ICD-10-CM | POA: Insufficient documentation

## 2021-07-09 DIAGNOSIS — Z122 Encounter for screening for malignant neoplasm of respiratory organs: Secondary | ICD-10-CM | POA: Insufficient documentation

## 2021-07-09 DIAGNOSIS — M5382 Other specified dorsopathies, cervical region: Secondary | ICD-10-CM | POA: Diagnosis present

## 2021-07-09 DIAGNOSIS — G5621 Lesion of ulnar nerve, right upper limb: Secondary | ICD-10-CM | POA: Insufficient documentation

## 2021-07-09 DIAGNOSIS — Z01812 Encounter for preprocedural laboratory examination: Secondary | ICD-10-CM | POA: Insufficient documentation

## 2021-07-09 DIAGNOSIS — Z719 Counseling, unspecified: Secondary | ICD-10-CM | POA: Insufficient documentation

## 2021-07-09 DIAGNOSIS — G8929 Other chronic pain: Secondary | ICD-10-CM | POA: Diagnosis present

## 2021-07-09 DIAGNOSIS — Z5189 Encounter for other specified aftercare: Secondary | ICD-10-CM | POA: Insufficient documentation

## 2021-07-09 DIAGNOSIS — Z01818 Encounter for other preprocedural examination: Secondary | ICD-10-CM | POA: Insufficient documentation

## 2021-07-09 DIAGNOSIS — F333 Major depressive disorder, recurrent, severe with psychotic symptoms: Secondary | ICD-10-CM | POA: Insufficient documentation

## 2021-07-09 DIAGNOSIS — Z4789 Encounter for other orthopedic aftercare: Secondary | ICD-10-CM | POA: Insufficient documentation

## 2021-07-09 DIAGNOSIS — R9431 Abnormal electrocardiogram [ECG] [EKG]: Secondary | ICD-10-CM | POA: Insufficient documentation

## 2021-07-12 ENCOUNTER — Ambulatory Visit: Payer: No Typology Code available for payment source | Admitting: Pain Medicine

## 2021-07-22 NOTE — Progress Notes (Unsigned)
PROVIDER NOTE: Interpretation of information contained herein should be left to medically-trained personnel. Specific patient instructions are provided elsewhere under "Patient Instructions" section of medical record. This document was created in part using STT-dictation technology, any transcriptional errors that may result from this process are unintentional.  Patient: Darrell Bailey Type: Established DOB: 1960/03/06 MRN: [id number] PCP: Joseph Art, MD  Service: Procedure DOS: 07/26/2021 Setting: Ambulatory Location: Ambulatory outpatient facility Delivery: Face-to-face Provider: Gaspar Cola, MD Specialty: Interventional Pain Management Specialty designation: 09 Location: Outpatient facility Ref. Prov.: Joseph Art, MD    Primary Reason for Visit: Interventional Pain Management Treatment. CC: No chief complaint on file.   Procedure:           Type: Cervical Facet Medial Branch Block(s) #1  Laterality: Right  Level: C3, C4, C5, C6, & C7 Medial Branch Level(s). Injecting these levels blocks the C3-4, C4-5, C5-6, and C6-7 cervical facet joints.  Imaging: Fluoroscopic guidance Anesthesia: Local anesthesia (1-2% Lidocaine) Anxiolysis: None                 Sedation: None. DOS: 07/26/2021  Performed by: Gaspar Cola, MD  Purpose: Diagnostic/Therapeutic Indications: Cervicalgia (cervical spine axial pain) severe enough to impact quality of life or function. No diagnosis found. NAS-11 Pain score:   Pre-procedure:  /10   Post-procedure:  /10     Position / Prep / Materials:  Position: Prone. Head in cradle. C-spine slightly flexed. Prep solution: DuraPrep (Iodine Povacrylex [0.7% available iodine] and Isopropyl Alcohol, 74% w/w) Prep Area: Posterior Cervico-thoracic Region. From occipital ridge to tip of scapula, and from shoulder to shoulder. Entire posterior and lateral neck surface. Materials:  Tray: Block Needle(s):  Type: Spinal  Gauge (G): 22"   Length: 3.5-in  Qty: 5  Pre-op H&P Assessment:  Darrell Bailey is a 61 y.o. (year old), male patient, seen today for interventional treatment. He  has a past surgical history that includes Chest tube insertion. Darrell Bailey has a current medication list which includes the following prescription(s): amlodipine, aripiprazole, loratadine, mirtazapine, prazosin, and sildenafil. His primarily concern today is the No chief complaint on file.  Initial Vital Signs:  Pulse/HCG Rate:    Temp:   Resp:   BP:   SpO2:    BMI: Estimated body mass index is 23.13 kg/m as calculated from the following:   Height as of 07/09/21: 6\' 4"  (1.93 m).   Weight as of 07/09/21: 190 lb (86.2 kg).  Risk Assessment: Allergies: Reviewed. He is allergic to tape.  Allergy Precautions: None required Coagulopathies: Reviewed. None identified.  Blood-thinner therapy: None at this time Active Infection(s): Reviewed. None identified. Darrell Bailey is afebrile  Site Confirmation: Darrell Bailey was asked to confirm the procedure and laterality before marking the site Procedure checklist: Completed Consent: Before the procedure and under the influence of no sedative(s), amnesic(s), or anxiolytics, the patient was informed of the treatment options, risks and possible complications. To fulfill our ethical and legal obligations, as recommended by the American Medical Association's Code of Ethics, I have informed the patient of my clinical impression; the nature and purpose of the treatment or procedure; the risks, benefits, and possible complications of the intervention; the alternatives, including doing nothing; the risk(s) and benefit(s) of the alternative treatment(s) or procedure(s); and the risk(s) and benefit(s) of doing nothing. The patient was provided information about the general risks and possible complications associated with the procedure. These may include, but are not limited to: failure to achieve desired goals,  infection,  bleeding, organ or nerve damage, allergic reactions, paralysis, and death. In addition, the patient was informed of those risks and complications associated to Spine-related procedures, such as failure to decrease pain; infection (i.e.: Meningitis, epidural or intraspinal abscess); bleeding (i.e.: epidural hematoma, subarachnoid hemorrhage, or any other type of intraspinal or peri-dural bleeding); organ or nerve damage (i.e.: Any type of peripheral nerve, nerve root, or spinal cord injury) with subsequent damage to sensory, motor, and/or autonomic systems, resulting in permanent pain, numbness, and/or weakness of one or several areas of the body; allergic reactions; (i.e.: anaphylactic reaction); and/or death. Furthermore, the patient was informed of those risks and complications associated with the medications. These include, but are not limited to: allergic reactions (i.e.: anaphylactic or anaphylactoid reaction(s)); adrenal axis suppression; blood sugar elevation that in diabetics may result in ketoacidosis or comma; water retention that in patients with history of congestive heart failure may result in shortness of breath, pulmonary edema, and decompensation with resultant heart failure; weight gain; swelling or edema; medication-induced neural toxicity; particulate matter embolism and blood vessel occlusion with resultant organ, and/or nervous system infarction; and/or aseptic necrosis of one or more joints. Finally, the patient was informed that Medicine is not an exact science; therefore, there is also the possibility of unforeseen or unpredictable risks and/or possible complications that may result in a catastrophic outcome. The patient indicated having understood very clearly. We have given the patient no guarantees and we have made no promises. Enough time was given to the patient to ask questions, all of which were answered to the patient's satisfaction. Darrell Bailey has indicated that he wanted to  continue with the procedure. Attestation: I, the ordering provider, attest that I have discussed with the patient the benefits, risks, side-effects, alternatives, likelihood of achieving goals, and potential problems during recovery for the procedure that I have provided informed consent. Date  Time: {CHL [facility]-PAIN TIME CHOICES:[id number]}  Pre-Procedure Preparation:  Monitoring: As per clinic protocol. Respiration, ETCO2, SpO2, BP, heart rate and rhythm monitor placed and checked for adequate function Safety Precautions: Patient was assessed for positional comfort and pressure points before starting the procedure. Time-out: I initiated and conducted the "Time-out" before starting the procedure, as per protocol. The patient was asked to participate by confirming the accuracy of the "Time Out" information. Verification of the correct person, site, and procedure were performed and confirmed by me, the nursing staff, and the patient. "Time-out" conducted as per Joint Commission's Universal Protocol (UP.01.01.01). Time:    Description/Narrative of Procedure:          Laterality: Right Targeted Levels:  C3, C4, C5, C6, & C7 Medial Branch Level(s).  Rationale (medical necessity): procedure needed and proper for the diagnosis and/or treatment of the patient's medical symptoms and needs. Procedural Technique Safety Precautions: Aspiration looking for blood return was conducted prior to all injections. At no point did we inject any substances, as a needle was being advanced. No attempts were made at seeking any paresthesias. Safe injection practices and needle disposal techniques used. Medications properly checked for expiration dates. SDV (single dose vial) medications used. Description of the Procedure: Protocol guidelines were followed. The patient was assisted into a comfortable position. The target area was identified and the area prepped in the usual manner. Skin & deeper tissues infiltrated with local  anesthetic. Appropriate amount of time allowed to pass for local anesthetics to take effect. The procedure needles were then advanced to the target area. Proper needle placement secured. Negative aspiration confirmed.  Solution injected in intermittent fashion, asking for systemic symptoms every 0.5cc of injectate. The needles were then removed and the area cleansed, making sure to leave some of the prepping solution back to take advantage of its long term bactericidal properties.  Technical description of process:  C3 Medial Branch Nerve Block (MBB): The target area for the C3 dorsal medial articular branch is the lateral concave waist of the articular pillar of C3. Under fluoroscopic guidance, a Quincke needle was inserted until contact was made with os over the postero-lateral aspect of the articular pillar of C3 (target area). After negative aspiration for blood, 0.5 mL of the nerve block solution was injected without difficulty or complication. The needle was removed intact. C4 Medial Branch Nerve Block (MBB): The target area for the C4 dorsal medial articular branch is the lateral concave waist of the articular pillar of C4. Under fluoroscopic guidance, a Quincke needle was inserted until contact was made with os over the postero-lateral aspect of the articular pillar of C4 (target area). After negative aspiration for blood, 0.5 mL of the nerve block solution was injected without difficulty or complication. The needle was removed intact. C5 Medial Branch Nerve Block (MBB): The target area for the C5 dorsal medial articular branch is the lateral concave waist of the articular pillar of C5. Under fluoroscopic guidance, a Quincke needle was inserted until contact was made with os over the postero-lateral aspect of the articular pillar of C5 (target area). After negative aspiration for blood, 0.5 mL of the nerve block solution was injected without difficulty or complication. The needle was removed intact. C6  Medial Branch Nerve Block (MBB): The target area for the C6 dorsal medial articular branch is the lateral concave waist of the articular pillar of C6. Under fluoroscopic guidance, a Quincke needle was inserted until contact was made with os over the postero-lateral aspect of the articular pillar of C6 (target area). After negative aspiration for blood, 0.5 mL of the nerve block solution was injected without difficulty or complication. The needle was removed intact. C7 Medial Branch Nerve Block (MBB): The target for the C7 dorsal medial articular branch lies on the superior-medial tip of the C7 transverse process. Under fluoroscopic guidance, a Quincke needle was inserted until contact was made with os over the postero-lateral aspect of the articular pillar of C7 (target area). After negative aspiration for blood, 0.5 mL of the nerve block solution was injected without difficulty or complication. The needle was removed intact.  Once the entire procedure was completed, the treated area was cleaned, making sure to leave some of the prepping solution back to take advantage of its long term bactericidal properties.  Anatomy Reference Guide:       There were no vitals filed for this visit.   Start Time:   hrs. End Time:   hrs.  Imaging Guidance (Spinal):          Type of Imaging Technique: Fluoroscopy Guidance (Spinal) Indication(s): Assistance in needle guidance and placement for procedures requiring needle placement in or near specific anatomical locations not easily accessible without such assistance. Exposure Time: Please see nurses notes. Contrast: None used. Fluoroscopic Guidance: I was personally present during the use of fluoroscopy. "Tunnel Vision Technique" used to obtain the best possible view of the target area. Parallax error corrected before commencing the procedure. "Direction-depth-direction" technique used to introduce the needle under continuous pulsed fluoroscopy. Once target was  reached, antero-posterior, oblique, and lateral fluoroscopic projection used confirm needle placement in all  planes. Images permanently stored in EMR. Interpretation: No contrast injected. I personally interpreted the imaging intraoperatively. Adequate needle placement confirmed in multiple planes. Permanent images saved into the patient's record.  Post-operative Assessment:  Post-procedure Vital Signs:  Pulse/HCG Rate:    Temp:   Resp:   BP:   SpO2:    EBL: None  Complications: No immediate post-treatment complications observed by team, or reported by patient.  Note: The patient tolerated the entire procedure well. A repeat set of vitals were taken after the procedure and the patient was kept under observation following institutional policy, for this type of procedure. Post-procedural neurological assessment was performed, showing return to baseline, prior to discharge. The patient was provided with post-procedure discharge instructions, including a section on how to identify potential problems. Should any problems arise concerning this procedure, the patient was given instructions to immediately contact us, at any time, without hesitation. In any case, we plan to contact the patient by telephone for a follow-up status report regarding this interventional procedure.  Comments:  No additional relevant information.  Plan of Care  Orders:  No orders of the defined types were placed in this encounter.  Chronic Opioid Analgesic:  None MME/day: 0 mg/day   Medications ordered for procedure: No orders of the defined types were placed in this encounter.  Medications administered: Benjie Karvonen. Plack had no medications administered during this visit.  See the medical record for exact dosing, route, and time of administration.  Follow-up plan:   No follow-ups on file.       Interventional Therapies  Risk  Complexity Considerations:   Estimated body mass index is 23.13 kg/m as calculated  from the following:   Height as of this encounter: 6\' 4"  (1.93 m).   Weight as of this encounter: 190 lb (86.2 kg). WNL   Planned  Pending:   Pending further evaluation   Under consideration:   Diagnostic/therapeutic cervical ESI #1    Completed:   None at this time   Therapeutic  Palliative (PRN) options:   None established     Recent Visits Date Type Provider Dept  07/09/21 Office Visit 07/11/21, MD [facility]  Showing recent visits within past 90 days and meeting all other requirements Future Appointments Date Type Provider Dept  07/26/21 Appointment 07/28/21, MD [facility]  Showing future appointments within next 90 days and meeting all other requirements  Disposition: Discharge home  Discharge (Date  Time): 07/26/2021;   hrs.   Primary Care Physician: 07/28/2021, MD Location: [facility] Note by: [facility], MD Date: 07/26/2021; Time: 4:21 PM  Disclaimer:  Medicine is not an 07/28/2021. The only guarantee in medicine is that nothing is guaranteed. It is important to note that the decision to proceed with this intervention was based on the information collected from the patient. The Data and conclusions were drawn from the patient's questionnaire, the interview, and the physical examination. Because the information was provided in large part by the patient, it cannot be guaranteed that it has not been purposely or unconsciously manipulated. Every effort has been made to obtain as much relevant data as possible for this evaluation. It is important to note that the conclusions that lead to this procedure are derived in large part from the available data. Always take into account that the treatment will also be dependent on availability of resources and existing treatment guidelines, considered by other Pain Management Practitioners as being common  knowledge and practice, at the time of the  intervention. For Medico-Legal purposes, it is also important to point out that variation in procedural techniques and pharmacological choices are the acceptable norm. The indications, contraindications, technique, and results of the above procedure should only be interpreted and judged by a Board-Certified Interventional Pain Specialist with extensive familiarity and expertise in the same exact procedure and technique.

## 2021-07-22 NOTE — Progress Notes (Unsigned)
PROVIDER NOTE: Information contained herein reflects review and annotations entered in association with encounter. Interpretation of such information and data should be left to medically-trained personnel. Information provided to patient can be located elsewhere in the medical record under "Patient Instructions". Document created using STT-dictation technology, any transcriptional errors that may result from process are unintentional.    Patient: Darrell Bailey  Service Category: E/M  Provider: Gaspar Cola, MD  DOB: 07/07/1960  DOS: 07/26/2021  Specialty: Interventional Pain Management  MRN: [id number]  Setting: Ambulatory outpatient  PCP: Darrell Art, MD  Type: Established Patient    Referring Provider: Joseph Art, MD  Location: Office  Delivery: Face-to-face     Primary Reason(s) for Visit: Encounter for evaluation before starting new chronic pain management plan of care (Level of risk: moderate) CC: No chief complaint on file.  HPI  Darrell Bailey is a 61 y.o. year old, male patient, who comes today for a follow-up evaluation to review the test results and decide on a treatment plan. He has Carpal tunnel syndrome (Right); Chronic obstructive pulmonary disease, unspecified ([place]); Depression, unspecified; Insomnia; Pulmonary embolism ([place]); Suicidal thoughts; Abnormal electrocardiogram (ECG) (EKG); Adjustment disorder with depressed mood; Chest pain, unspecified; Major depressive disorder, recurrent, unspecified ([place]); Encounter for other orthopedic aftercare; Encounter for removal of sutures; Essential hypertension; History of pneumothorax; Left ventricular hypertrophy; Lesion of ulnar nerve (Right); Major depressive disorder, recurrent, severe with psychotic symptoms ([place]); Metatarsalgia, unspecified foot; Myalgia, other site; Other pulmonary embolism without acute cor pulmonale ([place]); Encounter for preprocedural laboratory examination; Encounter for other preprocedural examination;  Encounter for screening for malignant neoplasm of respiratory organs; Encounter for immunization; Encounter for other specified aftercare; Counseling, unspecified; Other specified counseling; Tinea unguium; Onychomycosis; Chronic pain syndrome; Pharmacologic therapy; Disorder of skeletal system; Problems influencing health status; Abnormal MRI, cervical spine (04/18/2021); 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 His primarily concern today is the No chief complaint on file.  Pain Assessment: Location:     Radiating:   Onset:   Duration:   Quality:   Severity:  /10 (subjective, self-reported pain score)  Effect on ADL:   Timing:   Modifying factors:   BP:    HR:    Darrell Bailey comes in today for a follow-up visit after his initial evaluation on 07/09/2021. Today we went over the results of his tests. These were explained in "Layman's terms". During today's appointment we went over my diagnostic impression, as well as the proposed treatment plan.  ***  In considering the treatment plan options, Darrell Bailey was reminded that I no longer take patients for medication management only. I asked him to let me know if he had no intention of taking advantage of the interventional therapies, so that we could make arrangements to provide this space to someone interested. I also made it clear that undergoing interventional therapies for the purpose of getting pain medications is very inappropriate on the part of a patient, and it will not  be tolerated in this practice. This type of behavior would suggest true addiction and therefore it requires referral to an addiction specialist.  Further details on both, my assessment(s), as well as the proposed treatment plan, please see below.  Controlled Substance Pharmacotherapy Assessment REMS (Risk Evaluation and Mitigation Strategy)  Opioid Analgesic: None MME/day: 0 mg/day  Pill Count: None expected due to no prior prescriptions written by our practice. No notes on file Pharmacokinetics: Liberation and absorption (onset of action): WNL Distribution (time to peak effect): WNL Metabolism and excretion (duration of action): WNL         Pharmacodynamics: Desired effects: Analgesia: Darrell Bailey reports >50% benefit. Functional ability: Patient reports that medication allows him to accomplish basic ADLs Clinically meaningful improvement in function (CMIF): Sustained CMIF goals met Perceived effectiveness: Described as relatively effective, allowing for increase in activities of daily living (ADL) Undesirable effects: Side-effects or Adverse reactions: None reported Monitoring: [place] PMP: PDMP not reviewed this encounter. Online review of the past 41-monthperiod previously conducted. Not applicable at this point since we have not taken over the patient's medication management yet. List of other Serum/Urine Drug Screening Test(s):  No results found for: "AMPHSCRSER", "BARBSCRSER", "BENZOSCRSER", "COCAINSCRSER", "COCAINSCRNUR", "PCPSCRSER", "THCSCRSER", "THCU", "CANNABQUANT", "OPIATESCRSER", "OXYSCRSER", "PROPOXSCRSER", "ETH" List of all UDS test(s) done:  No results found for: "TOXASSSELUR", "SUMMARY" Last UDS on record: No results found for: "TOXASSSELUR", "SUMMARY" UDS interpretation: No unexpected findings.          Medication Assessment Form: Not applicable. No opioids. Treatment compliance: Not applicable Risk Assessment Profile: Aberrant behavior: See initial evaluations. None  observed or detected today Comorbid factors increasing risk of overdose: See initial evaluation. No additional risks detected today Opioid risk tool (ORT):     07/09/2021   10:27 AM  Opioid Risk   Alcohol 0  Illegal Drugs 0  Rx Drugs 0  Alcohol 3  Illegal Drugs 4  Rx Drugs 0  Age between 16-45 years  0  History of Preadolescent Sexual Abuse 0  Psychological Disease 0  Depression 0  Opioid Risk Tool Scoring 7  Opioid Risk Interpretation Moderate Risk    ORT Scoring interpretation table:  Score <3 = Low Risk for SUD  Score between 4-7 = Moderate Risk for SUD  Score >8 = High Risk for Opioid Abuse   Risk of substance use disorder (SUD): Low  Risk Mitigation Strategies:  Patient opioid safety counseling: No controlled substances prescribed. Patient-Prescriber Agreement (PPA): No agreement signed.  Controlled substance notification to other providers: None required. No opioid therapy.  Pharmacologic Plan: Non-opioid analgesic therapy offered. Interventional alternatives discussed.             Laboratory Chemistry Profile   Renal Lab Results  Component Value Date   BUN 16 06/10/2019   CREATININE 1.62 (H) 06/10/2019   GFRAA 53 (L) 06/10/2019   GFRNONAA 46 (L) 06/10/2019   PROTEINUR NEGATIVE 06/28/2010     Electrolytes Lab Results  Component Value Date   NA 140 06/10/2019   K 4.2 06/10/2019   CL 107 06/10/2019   CALCIUM 8.9 06/10/2019   MG 1.9 04/25/2008     Hepatic Lab Results  Component Value Date   AST 39 (H) 03/12/2008   ALT 14 03/12/2008   ALBUMIN 5.0 03/12/2008   ALKPHOS 85 03/12/2008   LIPASE 48 03/12/2008     ID No results found for: "LYMEIGGIGMAB", "HIV", "SARSCOV2NAA", "STAPHAUREUS", "MRSAPCR", "HCVAB", "PREGTESTUR", "RMSFIGG", "QFVRPH1IGG", "QFVRPH2IGG"   Bone No results found for: "VD25OH", "VHB716RC7ELF, "VYB0175ZW2, "VHE5277OE4, "25OHVITD1", "25OHVITD2", "25OHVITD3", "TESTOFREE", "TESTOSTERONE"   Endocrine Lab Results  Component Value Date    GLUCOSE 112 (H) 06/10/2019  GLUCOSEU NEGATIVE 06/28/2010   HGBA1C  04/25/2008    5.1 (NOTE) The ADA recommends the following therapeutic goal for glycemic control related to Hgb A1c measurement: Goal of therapy: <6.5 Hgb A1c  Reference: American Diabetes Association: Clinical Practice Recommendations 2010, Diabetes Care, 2010, 33: (Suppl  1).   TSH 1.125 ***Test methodology is 3rd generation TSH*** 04/25/2008     Neuropathy Lab Results  Component Value Date   HGBA1C  04/25/2008    5.1 (NOTE) The ADA recommends the following therapeutic goal for glycemic control related to Hgb A1c measurement: Goal of therapy: <6.5 Hgb A1c  Reference: American Diabetes Association: Clinical Practice Recommendations 2010, Diabetes Care, 2010, 33: (Suppl  1).     CNS No results found for: "COLORCSF", "APPEARCSF", "RBCCOUNTCSF", "WBCCSF", "POLYSCSF", "LYMPHSCSF", "EOSCSF", "PROTEINCSF", "GLUCCSF", "JCVIRUS", "CSFOLI", "IGGCSF", "LABACHR", "ACETBL"   Inflammation (CRP: Acute  ESR: Chronic) No results found for: "CRP", "ESRSEDRATE", "LATICACIDVEN"   Rheumatology No results found for: "RF", "ANA", "LABURIC", "URICUR", "LYMEIGGIGMAB", "LYMEABIGMQN", "HLAB27"   Coagulation Lab Results  Component Value Date   INR 2.09 (H) 06/28/2010   LABPROT 23.8 (H) 06/28/2010   APTT 36 06/28/2010   PLT 231 06/10/2019   DDIMER  04/25/2008    <0.22        AT THE INHOUSE ESTABLISHED CUTOFF VALUE OF 0.48 ug/mL FEU, THIS ASSAY HAS BEEN DOCUMENTED IN THE LITERATURE TO HAVE A SENSITIVITY AND NEGATIVE PREDICTIVE VALUE OF AT LEAST 98 TO 99%.  THE TEST RESULT SHOULD BE CORRELATED WITH AN ASSESSMENT OF THE CLINICAL PROBABILITY OF DVT / VTE.     Cardiovascular Lab Results  Component Value Date   CKTOTAL 318 (H) 06/28/2010   CKMB 2.8 06/28/2010   TROPONINI <0.30 06/28/2010   HGB 13.2 06/10/2019   HCT 40.7 06/10/2019     Screening No results found for: "SARSCOV2NAA", "COVIDSOURCE", "STAPHAUREUS", "MRSAPCR",  "HCVAB", "HIV", "PREGTESTUR"   Cancer No results found for: "CEA", "CA125", "LABCA2"   Allergens No results found for: "ALMOND", "APPLE", "ASPARAGUS", "AVOCADO", "BANANA", "BARLEY", "BASIL", "BAYLEAF", "GREENBEAN", "LIMABEAN", "WHITEBEAN", "BEEFIGE", "REDBEET", "BLUEBERRY", "BROCCOLI", "CABBAGE", "MELON", "CARROT", "CASEIN", "CASHEWNUT", "CAULIFLOWER", "CELERY"     Note: Lab results reviewed.  Recent Diagnostic Imaging Review  Cervical Imaging: Cervical MR wo contrast: No results found for this or any previous visit.  Cervical MR wo contrast: No valid procedures specified. Cervical MR w/wo contrast: No results found for this or any previous visit.  Cervical CT wo contrast: No results found for this or any previous visit.  Cervical CT outside: No results found for this or any previous visit.  Cervical DG F/E views: No results found for this or any previous visit.  Cervical DG Bending/F/E views: No results found for this or any previous visit.   Shoulder Imaging: Shoulder-R MR w/wo contrast: No results found for this or any previous visit.  Shoulder-L MR w/wo contrast: No results found for this or any previous visit.  Shoulder-R MR wo contrast: No results found for this or any previous visit.  Shoulder-L MR wo contrast: No results found for this or any previous visit.  Shoulder-R DG: Results for orders placed during the hospital encounter of 07/16/18  DG Shoulder Right  Narrative CLINICAL DATA:  Right shoulder pain for several months.  EXAM: RIGHT SHOULDER - 2+ VIEW  COMPARISON:  None.  FINDINGS: There is no evidence of fracture or dislocation. There is no evidence of arthropathy or other focal bone abnormality. Soft tissues are unremarkable.  IMPRESSION: Negative.   Electronically Signed By: Jeneen Rinks  Murlean Caller M.D. On: 07/16/2018 13:54  Shoulder-L DG: No results found for this or any previous visit.   Thoracic Imaging: Thoracic MR wo contrast: No results  found for this or any previous visit.  Thoracic MR wo contrast: No valid procedures specified. Thoracic MR w/wo contrast: No results found for this or any previous visit.  Thoracic CT wo contrast: No results found for this or any previous visit.  Thoracic CT w/wo contrast: No results found for this or any previous visit.  Thoracic CT w/wo contrast: No results found for this or any previous visit.  Thoracic DG 4 views: No results found for this or any previous visit.  Thoracic DG: No results found for this or any previous visit.  Thoracic DG w/swimmers view: No results found for this or any previous visit.   Lumbosacral Imaging: Lumbar MR wo contrast: No results found for this or any previous visit.  Lumbar MR wo contrast: No valid procedures specified. Lumbar MR w/wo contrast: No results found for this or any previous visit.  Lumbar CT wo contrast: No results found for this or any previous visit.  Lumbar CT w/wo contrast: No results found for this or any previous visit.  Lumbar CT w/wo contrast: No results found for this or any previous visit.  Lumbar DG (Complete) 4+V: No results found for this or any previous visit.        Lumbar DG F/E views: No results found for this or any previous visit.        Lumbar DG Bending views: No results found for this or any previous visit.        Lumbar DG Myelogram views: No results found for this or any previous visit.  Lumbar DG Myelogram: No results found for this or any previous visit.  Lumbar DG Myelogram: No results found for this or any previous visit.  Lumbar DG Myelogram: No results found for this or any previous visit.  Lumbar DG Myelogram Lumbosacral: No results found for this or any previous visit.   Sacroiliac Joint Imaging: Sacroiliac Joint DG: No results found for this or any previous visit.  Sacroiliac Joint MR w/wo contrast: No results found for this or any previous visit.  Sacroiliac Joint MR wo contrast: No  results found for this or any previous visit.   Spine Imaging: MRA Spinal Canal w/ cm: No results found for this or any previous visit.  MRA Spinal Canal wo/ cm: No valid procedures specified. MRA Spinal Canal w/wo cm: No results found for this or any previous visit.   Hip Imaging: Hip-R MR w/wo contrast: No results found for this or any previous visit.  Hip-L MR w/wo contrast: No results found for this or any previous visit.  Hip-R MR wo contrast: No results found for this or any previous visit.  Hip-L MR wo contrast: No results found for this or any previous visit.  Hip-R CT w/wo contrast: No results found for this or any previous visit.  Hip-L CT w/wo contrast: No results found for this or any previous visit.  Hip-R CT wo contrast: No results found for this or any previous visit.  Hip-L CT wo contrast: No results found for this or any previous visit.  Hip-R DG 2-3 views: No results found for this or any previous visit.  Hip-L DG 2-3 views: No results found for this or any previous visit.  Hip-B DG Bilateral: No results found for this or any previous visit.   Knee Imaging: Knee-R MR  w/wo contrast: No results found for this or any previous visit.  Knee-L MR w/wo contrast: No results found for this or any previous visit.  Knee-R MR wo contrast: No results found for this or any previous visit.  Knee-L MR wo contrast: No results found for this or any previous visit.  Knee-R CT w/wo contrast: No results found for this or any previous visit.  Knee-L CT w/wo contrast: No results found for this or any previous visit.  Knee-R CT wo contrast: No results found for this or any previous visit.  Knee-L CT wo contrast: No results found for this or any previous visit.  Knee-R DG 4 views: No results found for this or any previous visit.  Knee-L DG 4 views: No results found for this or any previous visit.   Ankle Imaging: Ankle-R DG Complete: No results found for this or any  previous visit.  Ankle-L DG Complete: No results found for this or any previous visit.   Foot Imaging: Foot-R DG Complete: No results found for this or any previous visit.  Foot-L DG Complete: No results found for this or any previous visit.   Elbow Imaging: Elbow-R DG Complete: Results for orders placed during the hospital encounter of 07/16/18  DG Elbow Complete Right  Narrative CLINICAL DATA:  Acute right elbow pain and swelling.  EXAM: RIGHT ELBOW - COMPLETE 3+ VIEW  COMPARISON:  None.  FINDINGS: There is no evidence of fracture, dislocation, or joint effusion. There is no evidence of arthropathy or other focal bone abnormality. Soft tissue swelling is seen overlying the olecranon.  IMPRESSION: No fracture or dislocation is noted. Soft tissue swelling is seen overlying the olecranon concerning for soft tissue infection or injury.   Electronically Signed By: Marijo Conception M.D. On: 07/16/2018 13:56  Elbow-L DG Complete: No results found for this or any previous visit.   Wrist Imaging: Wrist-R DG Complete: No results found for this or any previous visit.  Wrist-L DG Complete: No results found for this or any previous visit.   Hand Imaging: Hand-R DG Complete: No results found for this or any previous visit.  Hand-L DG Complete: No results found for this or any previous visit.   Complexity Note: Imaging results reviewed. Results shared with Mr. Wigger, using Layman's terms.                         Meds   Current Outpatient Medications:    amLODipine (NORVASC) 10 MG tablet, Take 1 tablet by mouth daily., Disp: , Rfl:    ARIPiprazole (ABILIFY) 5 MG tablet, TAKE ONE-HALF TABLET BY MOUTH EVERY MORNING FOR MOOD STABILIZATION AND CONTROL OF HALLUCINATION, Disp: , Rfl:    loratadine (CLARITIN) 10 MG tablet, Take 10 mg by mouth daily., Disp: , Rfl:    mirtazapine (REMERON) 15 MG tablet, Take 1 tablet by mouth at bedtime as needed., Disp: , Rfl:    prazosin  (MINIPRESS) 1 MG capsule, TAKE ONE CAPSULE BY MOUTH AT BEDTIME FOR NIGHTMARES, Disp: , Rfl:    sildenafil (VIAGRA) 100 MG tablet, TAKE ONE TABLET BY MOUTH AS DIRECTED (TAKE 1 HOUR PRIOR TO SEXUAL ACTIVITY *DO NOT EXCEED 1 DOSE PER 24 HOUR PERIOD*), Disp: , Rfl:   ROS  Constitutional: Denies any fever or chills Gastrointestinal: No reported hemesis, hematochezia, vomiting, or acute GI distress Musculoskeletal: Denies any acute onset joint swelling, redness, loss of ROM, or weakness Neurological: No reported episodes of acute onset apraxia, aphasia,  dysarthria, agnosia, amnesia, paralysis, loss of coordination, or loss of consciousness  Allergies  Mr. Madariaga is allergic to tape.  [place]  Drug: Mr. Borboa  reports current drug use. Drug: Marijuana. Alcohol:  reports current alcohol use. Tobacco:  reports that he has been smoking cigarettes. He has been smoking an average of .5 packs per day. He has never used smokeless tobacco. Medical:  has a past medical history of ADHD (attention deficit hyperactivity disorder), GERD (gastroesophageal reflux disease), Hypertension, Pneumonia, and Pulmonary embolism ([place]). Surgical: Mr. Leder  has a past surgical history that includes Chest tube insertion. Family: family history is not on file.  Constitutional Exam  General appearance: Well nourished, well developed, and well hydrated. In no apparent acute distress There were no vitals filed for this visit. BMI Assessment: Estimated body mass index is 23.13 kg/m as calculated from the following:   Height as of 07/09/21: '6\' 4"'  (1.93 m).   Weight as of 07/09/21: 190 lb (86.2 kg).  BMI interpretation table: BMI level Category Range association with higher incidence of chronic pain  <18 kg/m2 Underweight   18.5-24.9 kg/m2 Ideal body weight   25-29.9 kg/m2 Overweight Increased incidence by 20%  30-34.9 kg/m2 Obese (Class I) Increased incidence by 68%  35-39.9 kg/m2 Severe obesity (Class II) Increased  incidence by 136%  >40 kg/m2 Extreme obesity (Class III) Increased incidence by 254%   Patient's current BMI Ideal Body weight  There is no height or weight on file to calculate BMI. Ideal body weight: 86.8 kg (191 lb 5.7 oz)   BMI Readings from Last 4 Encounters:  07/09/21 23.13 kg/m  06/10/19 23.75 kg/m  07/16/18 23.88 kg/m  05/24/13 21.57 kg/m   Wt Readings from Last 4 Encounters:  07/09/21 190 lb (86.2 kg)  06/10/19 180 lb (81.6 kg)  07/16/18 181 lb (82.1 kg)  05/24/13 168 lb (76.2 kg)    Psych/Mental status: Alert, oriented x 3 (person, place, & time)       Eyes: PERLA Respiratory: No evidence of acute respiratory distress  Assessment & Plan  Primary Diagnosis & Pertinent Problem List: There were no encounter diagnoses.  Visit Diagnosis: No diagnosis found. Problems updated and reviewed during this visit: No problems updated.  Plan of Care  Pharmacotherapy (Medications Ordered): No orders of the defined types were placed in this encounter.  Procedure Orders    No procedure(s) ordered today   Lab Orders  No laboratory test(s) ordered today   Imaging Orders  No imaging studies ordered today   Referral Orders  No referral(s) requested today    Pharmacological management options:  Opioid Analgesics: I will not be prescribing any opioids at this time Membrane stabilizer: I will not be prescribing any at this time Muscle relaxant: I will not be prescribing any at this time NSAID: I will not be prescribing any at this time Other analgesic(s): I will not be prescribing any at this time     Interventional Therapies  Risk  Complexity Considerations:   Estimated body mass index is 23.13 kg/m as calculated from the following:   Height as of this encounter: '6\' 4"'  (1.93 m).   Weight as of this encounter: 190 lb (86.2 kg). WNL   Planned  Pending:   Pending further evaluation   Under consideration:   Diagnostic/therapeutic cervical ESI #1    Completed:    None at this time   Therapeutic  Palliative (PRN) options:   None established     Provider-requested follow-up: No follow-ups  on file. Recent Visits Date Type Provider Dept  07/09/21 Office Visit Milinda Pointer, MD [facility]  Showing recent visits within past 90 days and meeting all other requirements Future Appointments Date Type Provider Dept  07/26/21 Appointment Milinda Pointer, MD [facility]  Showing future appointments within next 90 days and meeting all other requirements  Primary Care Physician: Darrell Art, MD Note by: Darrell Cola, MD Date: 07/26/2021; Time: 4:20 PM

## 2021-07-24 ENCOUNTER — Ambulatory Visit: Payer: No Typology Code available for payment source | Admitting: Pain Medicine

## 2021-07-26 ENCOUNTER — Ambulatory Visit
Admission: RE | Admit: 2021-07-26 | Discharge: 2021-07-26 | Disposition: A | Discharge status: Home / Self Care | Payer: No Typology Code available for payment source | Source: Ambulatory Visit | Attending: Pain Medicine | Admitting: Pain Medicine

## 2021-07-26 ENCOUNTER — Ambulatory Visit: Payer: No Typology Code available for payment source | Attending: Pain Medicine | Admitting: Pain Medicine

## 2021-07-26 ENCOUNTER — Encounter: Payer: Self-pay | Admitting: Pain Medicine

## 2021-07-26 VITALS — BP 131/92 | HR 77 | Temp 97.0°F | Resp 16 | Ht 73.0 in | Wt 190.0 lb

## 2021-07-26 DIAGNOSIS — M5030 Other cervical disc degeneration, unspecified cervical region: Secondary | ICD-10-CM | POA: Diagnosis present

## 2021-07-26 DIAGNOSIS — M47812 Spondylosis without myelopathy or radiculopathy, cervical region: Secondary | ICD-10-CM | POA: Insufficient documentation

## 2021-07-26 DIAGNOSIS — M542 Cervicalgia: Secondary | ICD-10-CM | POA: Insufficient documentation

## 2021-07-26 DIAGNOSIS — M62838 Other muscle spasm: Secondary | ICD-10-CM | POA: Diagnosis present

## 2021-07-26 MED ORDER — MIDAZOLAM HCL 5 MG/5ML IJ SOLN
0.5000 mg | Freq: Once | INTRAMUSCULAR | Status: AC
  Administered 2021-07-26: 3 mg via INTRAVENOUS

## 2021-07-26 MED ORDER — ROPIVACAINE HCL 2 MG/ML IJ SOLN
9.0000 mL | Freq: Once | INTRAMUSCULAR | Status: AC
  Administered 2021-07-26: 9 mL via PERINEURAL

## 2021-07-26 MED ORDER — LIDOCAINE HCL 2 % IJ SOLN
20.0000 mL | Freq: Once | INTRAMUSCULAR | Status: AC
  Administered 2021-07-26: 400 mg

## 2021-07-26 MED ORDER — DEXAMETHASONE SODIUM PHOSPHATE 10 MG/ML IJ SOLN
INTRAMUSCULAR | Status: AC
  Filled 2021-07-26: qty 1

## 2021-07-26 MED ORDER — LIDOCAINE HCL 2 % IJ SOLN
INTRAMUSCULAR | Status: AC
  Filled 2021-07-26: qty 20

## 2021-07-26 MED ORDER — LACTATED RINGERS IV SOLN: Freq: Once | INTRAVENOUS | Status: AC

## 2021-07-26 MED ORDER — FENTANYL CITRATE (PF) 100 MCG/2ML IJ SOLN
INTRAMUSCULAR | Status: AC
  Filled 2021-07-26: qty 2

## 2021-07-26 MED ORDER — DEXAMETHASONE SODIUM PHOSPHATE 10 MG/ML IJ SOLN
10.0000 mg | Freq: Once | INTRAMUSCULAR | Status: AC
  Administered 2021-07-26: 10 mg

## 2021-07-26 MED ORDER — MIDAZOLAM HCL 5 MG/5ML IJ SOLN
INTRAMUSCULAR | Status: AC
  Filled 2021-07-26: qty 5

## 2021-07-26 MED ORDER — FENTANYL CITRATE (PF) 100 MCG/2ML IJ SOLN
25.0000 ug | INTRAMUSCULAR | Status: DC | PRN
  Administered 2021-07-26: 50 ug via INTRAVENOUS

## 2021-07-26 MED ORDER — ROPIVACAINE HCL 2 MG/ML IJ SOLN
INTRAMUSCULAR | Status: AC
  Filled 2021-07-26: qty 20

## 2021-07-26 NOTE — Patient Instructions (Signed)

## 2021-07-26 NOTE — Progress Notes (Signed)
Safety precautions to be maintained throughout the outpatient stay will include: orient to surroundings, keep bed in low position, maintain call bell within reach at all times, provide assistance with transfer out of bed and ambulation.  

## 2021-07-26 NOTE — Progress Notes (Signed)
1143 WASTED 2MG  VERSED AND FENTANYL IN APPROPRIATE CONTAINER WITH SHEILA BROWN RN AS WITNESS.

## 2021-07-27 ENCOUNTER — Telehealth: Payer: Self-pay

## 2021-07-27 NOTE — Telephone Encounter (Signed)
Post procedure follow up call.  Patient states he is doing OK.

## 2021-08-13 NOTE — Progress Notes (Unsigned)
Patient: Darrell Bailey  Service Category: E/M  Provider: Gaspar Cola, MD  DOB: 05-07-1960  DOS: 08/14/2021  Location: Office  MRN: [id number]  Setting: Ambulatory outpatient  Referring Provider: Joseph Art, MD  Type: Established Patient  Specialty: Interventional Pain Management  PCP: Joseph Art, MD  Location: Remote location  Delivery: TeleHealth     Virtual Encounter - Pain Management PROVIDER NOTE: Information contained herein reflects review and annotations entered in association with encounter. Interpretation of such information and data should be left to medically-trained personnel. Information provided to patient can be located elsewhere in the medical record under "Patient Instructions". Document created using STT-dictation technology, any transcriptional errors that may result from process are unintentional.    Contact & Pharmacy Preferred: [phone] Home: [phone] (home) Mobile: There is no such number on file (mobile). E-mail: [email or web address]  [place], [place] - [address] [place] [address] Phone: [phone] Fax: [phone]   Pre-screening  Mr. Carlisi offered "in-person" vs "virtual" encounter. He indicated preferring virtual for this encounter.   Reason COVID-19*  Social distancing based on CDC and AMA recommendations.   I contacted Duaine Dredge on 08/14/2021 via telephone.      I clearly identified myself as Gaspar Cola, MD. I verified that I was speaking with the correct person using two identifiers (Name: Darrell Bailey, and date of birth: March 30, 1960).  Consent I sought verbal advanced consent from Duaine Dredge for virtual visit interactions. I informed Mr. Hagedorn of possible security and privacy concerns, risks, and limitations associated with providing "not-in-person" medical evaluation and management services. I also  informed Mr. Fenter of the availability of "in-person" appointments. Finally, I informed him that there would be a charge for the virtual visit and that he could be  personally, fully or partially, financially responsible for it. Mr. Cerveny expressed understanding and agreed to proceed.   Historic Elements   [name] is a 61 y.o. year old, male patient evaluated today after our last contact on 07/26/2021. Mr. Guess  has a past medical history of ADHD (attention deficit hyperactivity disorder), GERD (gastroesophageal reflux disease), Hypertension, Pneumonia, and Pulmonary embolism ([place]). He also  has a past surgical history that includes Chest tube insertion. Mr. Sartin has a current medication list which includes the following prescription(s): amlodipine, aripiprazole, loratadine, mirtazapine, prazosin, and sildenafil. He  reports that he has quit smoking. His smoking use included cigarettes. He has never used smokeless tobacco. He reports current alcohol use. He reports current drug use. Drug: Marijuana. Mr. Iyengar is allergic to tape.   HPI  Today, he is being contacted for a post-procedure assessment.  According to the patient he attained 100% relief of the pain for the duration of the local anesthetic followed by an ongoing 50% relief of the pain on the right side which is the side that we injected.  However, he indicates having pain on both sides of the neck and he would like to have the procedure repeated on both sides.  Today I have talked to him about the possibility of radiofrequency ablation and he seems to be interested in this option.  Post-procedure evaluation   Type: Cervical Facet Medial Branch Block(s) #1  Laterality: Right  Level: C3, C4, C5, C6, & C7 Medial Branch Level(s). Injecting these levels blocks the C3-4, C4-5, C5-6, and C6-7 cervical facet joints.  Imaging: Fluoroscopic guidance Anesthesia: Local anesthesia (1-2% Lidocaine) Anxiolysis: IV  Versed          Sedation: Moderate conscious sedation. DOS: 07/26/2021  Performed by: Gaspar Cola, MD  Purpose: Diagnostic/Therapeutic Indications: Cervicalgia (cervical spine axial pain) severe enough to impact quality of life or function. 1. Cervical facet syndrome   2. Spondylosis without myelopathy or radiculopathy, cervical region   3. Cervicalgia   4. DDD (degenerative disc disease), cervical   5. Painful cervical range of motion   6. Spasm of cervical paraspinous muscle    NAS-11 Pain score:   Pre-procedure: 7 /10   Post-procedure: 0-No pain/10      Effectiveness:  Initial hour after procedure: 100 %. Subsequent 4-6 hours post-procedure: 100 %. Analgesia past initial 6 hours:  (right side 50   left side 0). Ongoing improvement:  Analgesic: The patient indicates having attained an ongoing 50% relief of the pain.  However for the duration of the local anesthetic he attained 100% relief. Function: Mr. Vanover reports improvement in function ROM: Mr. Kemmerling reports improvement in ROM  Pharmacotherapy Assessment   Opioid Analgesic: None MME/day: 0 mg/day   Monitoring: [place] PMP: PDMP reviewed during this encounter.       Pharmacotherapy: No side-effects or adverse reactions reported. Compliance: No problems identified. Effectiveness: Clinically acceptable. Plan: Refer to "POC". UDS: No results found for: "SUMMARY" No results found for: "CBDTHCR", "D8THCCBX", "D9THCCBX"   Laboratory Chemistry Profile   Renal Lab Results  Component Value Date   BUN 16 06/10/2019   CREATININE 1.62 (H) 06/10/2019   GFRAA 53 (L) 06/10/2019   GFRNONAA 46 (L) 06/10/2019    Hepatic Lab Results  Component Value Date   AST 39 (H) 03/12/2008   ALT 14 03/12/2008   ALBUMIN 5.0 03/12/2008   ALKPHOS 85 03/12/2008   LIPASE 48 03/12/2008    Electrolytes Lab Results  Component Value Date   NA 140 06/10/2019   K 4.2 06/10/2019   CL 107 06/10/2019   CALCIUM 8.9 06/10/2019   MG 1.9 04/25/2008     Bone No results found for: "VD25OH", "VD125OH2TOT", "DJ5701XB9", "TJ0300PQ3", "25OHVITD1", "25OHVITD2", "25OHVITD3", "TESTOFREE", "TESTOSTERONE"  Inflammation (CRP: Acute Phase) (ESR: Chronic Phase) No results found for: "CRP", "ESRSEDRATE", "LATICACIDVEN"       Note: Above Lab results reviewed.  Imaging  DG PAIN CLINIC C-ARM 1-60 MIN NO REPORT Fluoro was used, but no Radiologist interpretation will be provided.  Please refer to "NOTES" tab for provider progress note.  Assessment  The primary encounter diagnosis was Cervical facet syndrome. Diagnoses of Spondylosis without myelopathy or radiculopathy, cervical region, Cervicalgia, DDD (degenerative disc disease), cervical, Painful cervical range of motion, Spasm of cervical paraspinous muscle, Cervicogenic headache, Impaired range of motion of cervical spine, Vertigo of cervical arthrosis syndrome, Chronic pain syndrome, and Abnormal MRI, cervical spine (04/18/2021) were also pertinent to this visit.  Plan of Care  Problem-specific:  No problem-specific Assessment & Plan notes found for this encounter.  Mr. OSEPH IMBURGIA has a current medication list which includes the following long-term medication(s): amlodipine, aripiprazole, loratadine, mirtazapine, prazosin, and sildenafil.  Pharmacotherapy (Medications Ordered): No orders of the defined types were placed in this encounter.  Orders:  Orders Placed This Encounter  Procedures   CERVICAL FACET (MEDIAL BRANCH NERVE BLOCK)     Standing Status:   Future    Standing Expiration Date:   11/14/2021    Scheduling Instructions:     Side: Bilateral     Level: C3-4, C4-5, C5-6 Facet joints (C3, C4, C5, C6, & C7 Medial Branch  Nerves)     Sedation: Patient's choice.     Timeframe: As soon as schedule allows    Order Specific Question:   Where will this procedure be performed?    Answer:   [facility]   Follow-up plan:   Return for procedure (ECT): (B) C-FCT Blk #2.      Interventional Therapies  Risk  Complexity Considerations:   Estimated body mass index is 23.13 kg/m as calculated from the following:   Height as of this encounter: '6\' 4"'  (1.93 m).   Weight as of this encounter: 190 lb (86.2 kg). WNL      Planned  Pending:   Diagnostic bilateral cervical facet MBB #2    Under consideration:   Diagnostic bilateral cervical facet MBB #2    Completed:   Diagnostic right cervical facet MBB x1 (07/26/2021) (100/100/50/50)    Therapeutic  Palliative (PRN) options:   None established    Recent Visits Date Type Provider Dept  07/26/21 Procedure visit Milinda Pointer, MD [facility]  07/09/21 Office Visit Milinda Pointer, MD [facility]  Showing recent visits within past 90 days and meeting all other requirements Today's Visits Date Type Provider Dept  08/14/21 Office Visit Milinda Pointer, MD [facility]  Showing today's visits and meeting all other requirements Future Appointments No visits were found meeting these conditions. Showing future appointments within next 90 days and meeting all other requirements  I discussed the assessment and treatment plan with the patient. The patient was provided an opportunity to ask questions and all were answered. The patient agreed with the plan and demonstrated an understanding of the instructions.  Patient advised to call back or seek an in-person evaluation if the symptoms or condition worsens.  Duration of encounter: 16 minutes.  Note by: Gaspar Cola, MD Date: 08/14/2021; Time: 4:32 PM

## 2021-08-14 ENCOUNTER — Ambulatory Visit: Payer: No Typology Code available for payment source | Attending: Pain Medicine | Admitting: Pain Medicine

## 2021-08-14 DIAGNOSIS — M47812 Spondylosis without myelopathy or radiculopathy, cervical region: Secondary | ICD-10-CM | POA: Diagnosis not present

## 2021-08-14 DIAGNOSIS — M542 Cervicalgia: Secondary | ICD-10-CM

## 2021-08-14 DIAGNOSIS — M62838 Other muscle spasm: Secondary | ICD-10-CM | POA: Diagnosis not present

## 2021-08-14 DIAGNOSIS — M5030 Other cervical disc degeneration, unspecified cervical region: Secondary | ICD-10-CM | POA: Diagnosis not present

## 2021-08-14 DIAGNOSIS — G4486 Cervicogenic headache: Secondary | ICD-10-CM

## 2021-08-14 DIAGNOSIS — M479 Spondylosis, unspecified: Secondary | ICD-10-CM

## 2021-08-14 DIAGNOSIS — G894 Chronic pain syndrome: Secondary | ICD-10-CM

## 2021-08-14 DIAGNOSIS — R42 Dizziness and giddiness: Secondary | ICD-10-CM

## 2021-08-14 DIAGNOSIS — M5382 Other specified dorsopathies, cervical region: Secondary | ICD-10-CM

## 2021-08-14 DIAGNOSIS — R937 Abnormal findings on diagnostic imaging of other parts of musculoskeletal system: Secondary | ICD-10-CM

## 2021-08-14 NOTE — Patient Instructions (Signed)
______________________________________________________________________  Preparing for Procedure with Sedation  NOTICE: Due to recent regulatory changes, starting on August 14, 2020, procedures requiring intravenous (IV) sedation will no longer be performed at the [facility].  These types of procedures are required to be performed at [facility] ambulatory [facility] facility.  We are very sorry for the inconvenience.  Procedure appointments are limited to planned procedures: No Prescription Refills. No disability issues will be discussed. No medication changes will be discussed.  Instructions: Oral Intake: Do not eat or drink anything for at least 8 hours prior to your procedure. (Exception: Blood Pressure Medication. See below.) Transportation: A driver is required. You may not drive yourself after the procedure. Blood Pressure Medicine: Do not forget to take your blood pressure medicine with a sip of water the morning of the procedure. If your Diastolic (lower reading) is above 100 mmHg, elective cases will be cancelled/rescheduled. Blood thinners: These will need to be stopped for procedures. Notify our staff if you are taking any blood thinners. Depending on which one you take, there will be specific instructions on how and when to stop it. Diabetics on insulin: Notify the staff so that you can be scheduled 1st case in the morning. If your diabetes requires high dose insulin, take only  of your normal insulin dose the morning of the procedure and notify the staff that you have done so. Preventing infections: Shower with an antibacterial soap the morning of your procedure. Build-up your immune system: Take 1000 mg of Vitamin C with every meal (3 times a day) the day prior to your procedure. Antibiotics: Inform the staff if you have a condition or reason that requires you to take antibiotics before dental procedures. Pregnancy: If you are pregnant, call and cancel the procedure. Sickness: If  you have a cold, fever, or any active infections, call and cancel the procedure. Arrival: You must be in the facility at least 30 minutes prior to your scheduled procedure. Children: Do not bring children with you. Dress appropriately: There is always the possibility that your clothing may get soiled. Valuables: Do not bring any jewelry or valuables.  Reasons to call and reschedule or cancel your procedure: (Following these recommendations will minimize the risk of a serious complication.) Surgeries: Avoid having procedures within 2 weeks of any surgery. (Avoid for 2 weeks before or after any surgery). Flu Shots: Avoid having procedures within 2 weeks of a flu shots. (Avoid for 2 weeks before or after immunizations). Barium: Avoid having a procedure within 7-10 days after having had a radiological study involving the use of radiological contrast. (Myelograms, Barium swallow or enema study). Heart attacks: Avoid any elective procedures or surgeries for the initial 6 months after a "Myocardial Infarction" (Heart Attack). Blood thinners: It is imperative that you stop these medications before procedures. Let us know if you if you take any blood thinner.  Infection: Avoid procedures during or within two weeks of an infection (including chest colds or gastrointestinal problems). Symptoms associated with infections include: Localized redness, fever, chills, night sweats or profuse sweating, burning sensation when voiding, cough, congestion, stuffiness, runny nose, sore throat, diarrhea, nausea, vomiting, cold or Flu symptoms, recent or current infections. It is specially important if the infection is over the area that we intend to treat. Heart and lung problems: Symptoms that may suggest an active cardiopulmonary problem include: cough, chest pain, breathing difficulties or shortness of breath, dizziness, ankle swelling, uncontrolled high or unusually low blood pressure, and/or palpitations. If you are    experiencing any of these symptoms, cancel your procedure and contact your primary care physician for an evaluation.  Remember:  Regular Business hours are:  Monday to Thursday 8:00 AM to 4:00 PM  Provider's Schedule: Oluwaseyi Raffel, MD:  Procedure days: Tuesday and Thursday 7:30 AM to 4:00 PM  Bilal Lateef, MD:  Procedure days: Monday and Wednesday 7:30 AM to 4:00 PM ______________________________________________________________________  ____________________________________________________________________________________________  General Risks and Possible Complications  Patient Responsibilities: It is important that you read this as it is part of your informed consent. It is our duty to inform you of the risks and possible complications associated with treatments offered to you. It is your responsibility as a patient to read this and to ask questions about anything that is not clear or that you believe was not covered in this document.  Patient's Rights: You have the right to refuse treatment. You also have the right to change your mind, even after initially having agreed to have the treatment done. However, under this last option, if you wait until the last second to change your mind, you may be charged for the materials used up to that point.  Introduction: Medicine is not an exact science. Everything in Medicine, including the lack of treatment(s), carries the potential for danger, harm, or loss (which is by definition: Risk). In Medicine, a complication is a secondary problem, condition, or disease that can aggravate an already existing one. All treatments carry the risk of possible complications. The fact that a side effects or complications occurs, does not imply that the treatment was conducted incorrectly. It must be clearly understood that these can happen even when everything is done following the highest safety standards.  No treatment: You can choose not to proceed with the  proposed treatment alternative. The "PRO(s)" would include: avoiding the risk of complications associated with the therapy. The "CON(s)" would include: not getting any of the treatment benefits. These benefits fall under one of three categories: diagnostic; therapeutic; and/or palliative. Diagnostic benefits include: getting information which can ultimately lead to improvement of the disease or symptom(s). Therapeutic benefits are those associated with the successful treatment of the disease. Finally, palliative benefits are those related to the decrease of the primary symptoms, without necessarily curing the condition (example: decreasing the pain from a flare-up of a chronic condition, such as incurable terminal cancer).  General Risks and Complications: These are associated to most interventional treatments. They can occur alone, or in combination. They fall under one of the following six (6) categories: no benefit or worsening of symptoms; bleeding; infection; nerve damage; allergic reactions; and/or death. No benefits or worsening of symptoms: In Medicine there are no guarantees, only probabilities. No healthcare provider can ever guarantee that a medical treatment will work, they can only state the probability that it may. Furthermore, there is always the possibility that the condition may worsen, either directly, or indirectly, as a consequence of the treatment. Bleeding: This is more common if the patient is taking a blood thinner, either prescription or over the counter (example: Goody Powders, Fish oil, Aspirin, Garlic, etc.), or if suffering a condition associated with impaired coagulation (example: Hemophilia, cirrhosis of the liver, low platelet counts, etc.). However, even if you do not have one on these, it can still happen. If you have any of these conditions, or take one of these drugs, make sure to notify your treating physician. Infection: This is more common in patients with a compromised  immune system, either due to disease (example:   diabetes, cancer, human immunodeficiency virus [HIV], etc.), or due to medications or treatments (example: therapies used to treat cancer and rheumatological diseases). However, even if you do not have one on these, it can still happen. If you have any of these conditions, or take one of these drugs, make sure to notify your treating physician. Nerve Damage: This is more common when the treatment is an invasive one, but it can also happen with the use of medications, such as those used in the treatment of cancer. The damage can occur to small secondary nerves, or to large primary ones, such as those in the spinal cord and brain. This damage may be temporary or permanent and it may lead to impairments that can range from temporary numbness to permanent paralysis and/or brain death. Allergic Reactions: Any time a substance or material comes in contact with our body, there is the possibility of an allergic reaction. These can range from a mild skin rash (contact dermatitis) to a severe systemic reaction (anaphylactic reaction), which can result in death. Death: In general, any medical intervention can result in death, most of the time due to an unforeseen complication. ____________________________________________________________________________________________  

## 2021-09-11 ENCOUNTER — Other Ambulatory Visit: Payer: Self-pay

## 2021-09-11 ENCOUNTER — Ambulatory Visit: Payer: No Typology Code available for payment source | Attending: Pain Medicine | Admitting: Pain Medicine

## 2021-09-11 ENCOUNTER — Encounter: Payer: Self-pay | Admitting: Pain Medicine

## 2021-09-11 ENCOUNTER — Ambulatory Visit
Admission: RE | Admit: 2021-09-11 | Discharge: 2021-09-11 | Disposition: A | Discharge status: Home / Self Care | Payer: No Typology Code available for payment source | Source: Ambulatory Visit | Attending: Pain Medicine | Admitting: Pain Medicine

## 2021-09-11 VITALS — BP 124/86 | HR 49 | Temp 97.2°F | Resp 14 | Ht 73.75 in | Wt 190.0 lb

## 2021-09-11 DIAGNOSIS — M5382 Other specified dorsopathies, cervical region: Secondary | ICD-10-CM | POA: Insufficient documentation

## 2021-09-11 DIAGNOSIS — Z1211 Encounter for screening for malignant neoplasm of colon: Secondary | ICD-10-CM | POA: Insufficient documentation

## 2021-09-11 DIAGNOSIS — M542 Cervicalgia: Secondary | ICD-10-CM | POA: Insufficient documentation

## 2021-09-11 DIAGNOSIS — R937 Abnormal findings on diagnostic imaging of other parts of musculoskeletal system: Secondary | ICD-10-CM | POA: Insufficient documentation

## 2021-09-11 DIAGNOSIS — M5030 Other cervical disc degeneration, unspecified cervical region: Secondary | ICD-10-CM | POA: Diagnosis present

## 2021-09-11 DIAGNOSIS — G4486 Cervicogenic headache: Secondary | ICD-10-CM | POA: Diagnosis present

## 2021-09-11 DIAGNOSIS — M47812 Spondylosis without myelopathy or radiculopathy, cervical region: Secondary | ICD-10-CM | POA: Insufficient documentation

## 2021-09-11 DIAGNOSIS — Z5189 Encounter for other specified aftercare: Secondary | ICD-10-CM | POA: Insufficient documentation

## 2021-09-11 MED ORDER — FENTANYL CITRATE (PF) 100 MCG/2ML IJ SOLN
INTRAMUSCULAR | Status: AC
  Filled 2021-09-11: qty 2

## 2021-09-11 MED ORDER — LIDOCAINE HCL 2 % IJ SOLN
20.0000 mL | Freq: Once | INTRAMUSCULAR | Status: AC
  Administered 2021-09-11: 400 mg

## 2021-09-11 MED ORDER — MIDAZOLAM HCL 5 MG/5ML IJ SOLN
0.5000 mg | Freq: Once | INTRAMUSCULAR | Status: AC
  Administered 2021-09-11: 2 mg via INTRAVENOUS

## 2021-09-11 MED ORDER — FENTANYL CITRATE (PF) 100 MCG/2ML IJ SOLN
25.0000 ug | INTRAMUSCULAR | Status: DC | PRN
  Administered 2021-09-11: 50 ug via INTRAVENOUS

## 2021-09-11 MED ORDER — DEXAMETHASONE SODIUM PHOSPHATE 10 MG/ML IJ SOLN
INTRAMUSCULAR | Status: AC
  Filled 2021-09-11: qty 2

## 2021-09-11 MED ORDER — LACTATED RINGERS IV SOLN: Freq: Once | INTRAVENOUS | Status: AC

## 2021-09-11 MED ORDER — ROPIVACAINE HCL 2 MG/ML IJ SOLN
INTRAMUSCULAR | Status: AC
  Filled 2021-09-11: qty 20

## 2021-09-11 MED ORDER — DEXAMETHASONE SODIUM PHOSPHATE 10 MG/ML IJ SOLN
20.0000 mg | Freq: Once | INTRAMUSCULAR | Status: AC
  Administered 2021-09-11: 20 mg

## 2021-09-11 MED ORDER — MIDAZOLAM HCL 5 MG/5ML IJ SOLN
INTRAMUSCULAR | Status: AC
  Filled 2021-09-11: qty 5

## 2021-09-11 MED ORDER — LIDOCAINE HCL 2 % IJ SOLN
INTRAMUSCULAR | Status: AC
  Filled 2021-09-11: qty 20

## 2021-09-11 MED ORDER — GLYCOPYRROLATE 0.2 MG/ML IJ SOLN: 0.2000 mg | Freq: Once | INTRAMUSCULAR | Status: DC

## 2021-09-11 MED ORDER — ROPIVACAINE HCL 2 MG/ML IJ SOLN
18.0000 mL | Freq: Once | INTRAMUSCULAR | Status: AC
  Administered 2021-09-11: 18 mL via PERINEURAL

## 2021-09-11 NOTE — Patient Instructions (Signed)

## 2021-09-11 NOTE — Progress Notes (Signed)
3646 GLYCOPYRROLOATE 0.2MG  IV PER MD ODER

## 2021-09-11 NOTE — Progress Notes (Signed)
PROVIDER NOTE: Interpretation of information contained herein should be left to medically-trained personnel. Specific patient instructions are provided elsewhere under "Patient Instructions" section of medical record. This document was created in part using STT-dictation technology, any transcriptional errors that may result from this process are unintentional.  Patient: Darrell Bailey Type: Established DOB: [birth year] MRN: [id number] PCP: Joseph Art, MD  Service: Procedure DOS: 09/11/2021 Setting: Ambulatory Location: Ambulatory outpatient facility Delivery: Face-to-face Provider: Gaspar Cola, MD Specialty: Interventional Pain Management Specialty designation: 09 Location: Outpatient facility Ref. Prov.: Joseph Art, MD    Primary Reason for Visit: Interventional Pain Management Treatment. CC: Neck Pain (Bilateral )   Procedure:           Type: Cervical Facet Medial Branch Block(s) #2  Laterality: Bilateral  Level: C3, C4, C5, C6, & C7 Medial Branch Level(s). Injecting these levels blocks the C3-4, C4-5, C5-6, and C6-7 cervical facet joints.  Imaging: Fluoroscopic guidance Anesthesia: Local anesthesia (1-2% Lidocaine) Anxiolysis: IV Versed 2.0 mg Sedation: Moderate Sedation  Fentanyl 50 mcg .  DOS: 09/11/2021  Performed by: Gaspar Cola, MD  Purpose: Diagnostic/Therapeutic Indications: Cervicalgia (cervical spine axial pain) severe enough to impact quality of life or function. 1. Cervical facet syndrome   2. Spondylosis without myelopathy or radiculopathy, cervical region   3. Cervicalgia   4. Cervicogenic headache   5. DDD (degenerative disc disease), cervical   6. Abnormal MRI, cervical spine (04/18/2021)   7. Encounter for therapeutic procedure    NAS-11 Pain score:   Pre-procedure: 6 /10   Post-procedure: 0-No pain/10     Position / Prep / Materials:  Position: Prone. Head in cradle. C-spine slightly flexed. Prep solution: DuraPrep (Iodine  Povacrylex [0.7% available iodine] and Isopropyl Alcohol, 74% w/w) Prep Area: Posterior Cervico-thoracic Region. From occipital ridge to tip of scapula, and from shoulder to shoulder. Entire posterior and lateral neck surface. Materials:  Tray: Block Needle(s):  Type: Spinal  Gauge (G): 22"  Length: 3.5-in  Qty: 5  Pre-op H&P Assessment:  Darrell Bailey is a 61 y.o. (year old), male patient, seen today for interventional treatment. He  has a past surgical history that includes Chest tube insertion. Darrell Bailey has a current medication list which includes the following prescription(s): amlodipine, aripiprazole, loratadine, mirtazapine, prazosin, and sildenafil, and the following Facility-Administered Medications: fentanyl and glycopyrrolate. His primarily concern today is the Neck Pain (Bilateral )  Initial Vital Signs:  Pulse/HCG Rate: (!) 49ECG Heart Rate: (!) 48 (SB) Temp: (!) 97.3 F (36.3 C) Resp: 16 BP: 139/86 SpO2: 100 %  BMI: Estimated body mass index is 24.56 kg/m as calculated from the following:   Height as of this encounter: 6' 1.75" (1.873 m).   Weight as of this encounter: 190 lb (86.2 kg).  Risk Assessment: Allergies: Reviewed. He is allergic to tape.  Allergy Precautions: None required Coagulopathies: Reviewed. None identified.  Blood-thinner therapy: None at this time Active Infection(s): Reviewed. None identified. [name] is afebrile  Site Confirmation: Darrell Bailey was asked to confirm the procedure and laterality before marking the site Procedure checklist: Completed Consent: Before the procedure and under the influence of no sedative(s), amnesic(s), or anxiolytics, the patient was informed of the treatment options, risks and possible complications. To fulfill our ethical and legal obligations, as recommended by the American Medical Association's Code of Ethics, I have informed the patient of my clinical impression; the nature and purpose of the treatment or  procedure; the risks, benefits, and possible complications of  the intervention; the alternatives, including doing nothing; the risk(s) and benefit(s) of the alternative treatment(s) or procedure(s); and the risk(s) and benefit(s) of doing nothing. The patient was provided information about the general risks and possible complications associated with the procedure. These may include, but are not limited to: failure to achieve desired goals, infection, bleeding, organ or nerve damage, allergic reactions, paralysis, and death. In addition, the patient was informed of those risks and complications associated to Spine-related procedures, such as failure to decrease pain; infection (i.e.: Meningitis, epidural or intraspinal abscess); bleeding (i.e.: epidural hematoma, subarachnoid hemorrhage, or any other type of intraspinal or peri-dural bleeding); organ or nerve damage (i.e.: Any type of peripheral nerve, nerve root, or spinal cord injury) with subsequent damage to sensory, motor, and/or autonomic systems, resulting in permanent pain, numbness, and/or weakness of one or several areas of the body; allergic reactions; (i.e.: anaphylactic reaction); and/or death. Furthermore, the patient was informed of those risks and complications associated with the medications. These include, but are not limited to: allergic reactions (i.e.: anaphylactic or anaphylactoid reaction(s)); adrenal axis suppression; blood sugar elevation that in diabetics may result in ketoacidosis or comma; water retention that in patients with history of congestive heart failure may result in shortness of breath, pulmonary edema, and decompensation with resultant heart failure; weight gain; swelling or edema; medication-induced neural toxicity; particulate matter embolism and blood vessel occlusion with resultant organ, and/or nervous system infarction; and/or aseptic necrosis of one or more joints. Finally, the patient was informed that Medicine is  not an exact science; therefore, there is also the possibility of unforeseen or unpredictable risks and/or possible complications that may result in a catastrophic outcome. The patient indicated having understood very clearly. We have given the patient no guarantees and we have made no promises. Enough time was given to the patient to ask questions, all of which were answered to the patient's satisfaction. Mr. Mcmurtrey has indicated that he wanted to continue with the procedure. Attestation: I, the ordering provider, attest that I have discussed with the patient the benefits, risks, side-effects, alternatives, likelihood of achieving goals, and potential problems during recovery for the procedure that I have provided informed consent. Date  Time: 09/11/2021  8:48 AM  Pre-Procedure Preparation:  Monitoring: As per clinic protocol. Respiration, ETCO2, SpO2, BP, heart rate and rhythm monitor placed and checked for adequate function Safety Precautions: Patient was assessed for positional comfort and pressure points before starting the procedure. Time-out: I initiated and conducted the "Time-out" before starting the procedure, as per protocol. The patient was asked to participate by confirming the accuracy of the "Time Out" information. Verification of the correct person, site, and procedure were performed and confirmed by me, the nursing staff, and the patient. "Time-out" conducted as per Joint Commission's Universal Protocol (UP.01.01.01). Time: 0945  Description/Narrative of Procedure:          Laterality: Bilateral. The procedure was performed in identical fashion on both sides. Targeted Levels:  C3, C4, C5, C6, & C7 Medial Branch Level(s).  Rationale (medical necessity): procedure needed and proper for the diagnosis and/or treatment of the patient's medical symptoms and needs. Procedural Technique Safety Precautions: Aspiration looking for blood return was conducted prior to all injections. At no point  did we inject any substances, as a needle was being advanced. No attempts were made at seeking any paresthesias. Safe injection practices and needle disposal techniques used. Medications properly checked for expiration dates. SDV (single dose vial) medications used. Description of the Procedure: Protocol  guidelines were followed. The patient was assisted into a comfortable position. The target area was identified and the area prepped in the usual manner. Skin & deeper tissues infiltrated with local anesthetic. Appropriate amount of time allowed to pass for local anesthetics to take effect. The procedure needles were then advanced to the target area. Proper needle placement secured. Negative aspiration confirmed. Solution injected in intermittent fashion, asking for systemic symptoms every 0.5cc of injectate. The needles were then removed and the area cleansed, making sure to leave some of the prepping solution back to take advantage of its long term bactericidal properties.  Technical description of process:  C3 Medial Branch Nerve Block (MBB): The target area for the C3 dorsal medial articular branch is the lateral concave waist of the articular pillar of C3. Under fluoroscopic guidance, a Quincke needle was inserted until contact was made with os over the postero-lateral aspect of the articular pillar of C3 (target area). After negative aspiration for blood, 0.5 mL of the nerve block solution was injected without difficulty or complication. The needle was removed intact. C4 Medial Branch Nerve Block (MBB): The target area for the C4 dorsal medial articular branch is the lateral concave waist of the articular pillar of C4. Under fluoroscopic guidance, a Quincke needle was inserted until contact was made with os over the postero-lateral aspect of the articular pillar of C4 (target area). After negative aspiration for blood, 0.5 mL of the nerve block solution was injected without difficulty or complication. The  needle was removed intact. C5 Medial Branch Nerve Block (MBB): The target area for the C5 dorsal medial articular branch is the lateral concave waist of the articular pillar of C5. Under fluoroscopic guidance, a Quincke needle was inserted until contact was made with os over the postero-lateral aspect of the articular pillar of C5 (target area). After negative aspiration for blood, 0.5 mL of the nerve block solution was injected without difficulty or complication. The needle was removed intact. C6 Medial Branch Nerve Block (MBB): The target area for the C6 dorsal medial articular branch is the lateral concave waist of the articular pillar of C6. Under fluoroscopic guidance, a Quincke needle was inserted until contact was made with os over the postero-lateral aspect of the articular pillar of C6 (target area). After negative aspiration for blood, 0.5 mL of the nerve block solution was injected without difficulty or complication. The needle was removed intact. C7 Medial Branch Nerve Block (MBB): The target for the C7 dorsal medial articular branch lies on the superior-medial tip of the C7 transverse process. Under fluoroscopic guidance, a Quincke needle was inserted until contact was made with os over the postero-lateral aspect of the articular pillar of C7 (target area). After negative aspiration for blood, 0.5 mL of the nerve block solution was injected without difficulty or complication. The needle was removed intact.  Once the entire procedure was completed, the treated area was cleaned, making sure to leave some of the prepping solution back to take advantage of its long term bactericidal properties.  Anatomy Reference Guide:       Vitals:   09/11/21 1000 09/11/21 1005 09/11/21 1015 09/11/21 1028  BP: 114/87 125/81 (!) 128/94 124/86  Pulse:      Resp: 18 13 13 14   Temp:  (!) 97.4 F (36.3 C)  (!) 97.2 F (36.2 C)  TempSrc:      SpO2: 95% 96% 98% 98%  Weight:      Height:  Start  Time: 0945 hrs. End Time: 0959 hrs.  Imaging Guidance (Spinal):          Type of Imaging Technique: Fluoroscopy Guidance (Spinal) Indication(s): Assistance in needle guidance and placement for procedures requiring needle placement in or near specific anatomical locations not easily accessible without such assistance. Exposure Time: Please see nurses notes. Contrast: None used. Fluoroscopic Guidance: I was personally present during the use of fluoroscopy. "Tunnel Vision Technique" used to obtain the best possible view of the target area. Parallax error corrected before commencing the procedure. "Direction-depth-direction" technique used to introduce the needle under continuous pulsed fluoroscopy. Once target was reached, antero-posterior, oblique, and lateral fluoroscopic projection used confirm needle placement in all planes. Images permanently stored in EMR. Interpretation: No contrast injected. I personally interpreted the imaging intraoperatively. Adequate needle placement confirmed in multiple planes. Permanent images saved into the patient's record.  Post-operative Assessment:  Post-procedure Vital Signs:  Pulse/HCG Rate: (!) 4972 Temp: (!) 97.2 F (36.2 C) Resp: 14 BP: 124/86 SpO2: 98 %  EBL: None  Complications: No immediate post-treatment complications observed by team, or reported by patient.  Note: The patient tolerated the entire procedure well. A repeat set of vitals were taken after the procedure and the patient was kept under observation following institutional policy, for this type of procedure. Post-procedural neurological assessment was performed, showing return to baseline, prior to discharge. The patient was provided with post-procedure discharge instructions, including a section on how to identify potential problems. Should any problems arise concerning this procedure, the patient was given instructions to immediately contact us, at any time, without hesitation. In any  case, we plan to contact the patient by telephone for a follow-up status report regarding this interventional procedure.  Comments:  No additional relevant information.  Plan of Care  Orders:  Orders Placed This Encounter  Procedures   CERVICAL FACET (MEDIAL BRANCH NERVE BLOCK)     Scheduling Instructions:     Side: Bilateral     Level: C3-4, C4-5, C5-6 Facet joints (C3, C4, C5, C6, & C7 Medial Branch Nerves)     Sedation: Patient's choice.     Timeframe: Today    Order Specific Question:   Where will this procedure be performed?    Answer:   [facility]   [facility] C-ARM 1-60 MIN NO REPORT    Intraoperative interpretation by procedural physician at [facility].    Standing Status:   Standing    Number of Occurrences:   1    Order Specific Question:   Reason for exam:    Answer:   Assistance in needle guidance and placement for procedures requiring needle placement in or near specific anatomical locations not easily accessible without such assistance.   Informed Consent Details: Physician/Practitioner Attestation; Transcribe to consent form and obtain patient signature    Note: Always confirm laterality of pain with Mr. Feinstein, before procedure. Transcribe to consent form and obtain patient signature.    Order Specific Question:   Physician/Practitioner attestation of informed consent for procedure/surgical case    Answer:   I, the physician/practitioner, attest that I have discussed with the patient the benefits, risks, side effects, alternatives, likelihood of achieving goals and potential problems during recovery for the procedure that I have provided informed consent.    Order Specific Question:   Procedure    Answer:   Bilateral Cervical facet block under fluoroscopic guidance.    Order Specific Question:   Physician/Practitioner performing the procedure  Answer:   Sheala Dosh A. 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   Provide equipment / supplies at bedside    "Block Tray" (Disposable  single use) Needle type: SpinalSpinal Amount/quantity: 5 Size: Regular (3.5-inch) Gauge: 22G    Standing Status:   Standing    Number of Occurrences:   1    Order Specific Question:   Specify    Answer:   Block Tray   Chronic Opioid Analgesic:  None MME/day: 0 mg/day   Medications ordered for procedure: Meds ordered this encounter  Medications   lidocaine (XYLOCAINE) 2 % (with pres) injection 400 mg   lactated ringers infusion   midazolam (VERSED) 5 MG/5ML injection 0.5-2 mg    Make sure Flumazenil is available in the pyxis when using this medication. If oversedation occurs, administer 0.2 mg IV over 15 sec. If after 45 sec no response, administer 0.2 mg again over 1 min; may repeat at 1 min intervals; not to exceed 4 doses (1 mg)   fentaNYL (SUBLIMAZE) injection 25-50 mcg    Make sure Narcan is available in the pyxis when using this medication. In the event of respiratory depression (RR< 8/min): Titrate NARCAN (naloxone) in increments of 0.1 to 0.2 mg IV at 2-3 minute intervals, until desired degree of reversal.   ropivacaine (PF) 2 mg/mL (0.2%) (NAROPIN) injection 18 mL   dexamethasone (DECADRON) injection 20 mg   glycopyrrolate (ROBINUL) injection 0.2 mg   Medications administered: We administered lidocaine, lactated ringers, midazolam, fentaNYL, ropivacaine (PF) 2 mg/mL (0.2%), and dexamethasone.  See the medical record for exact dosing, route, and time of administration.  Follow-up plan:   Return in about 2 weeks (around 09/25/2021) for Proc-day (T,Th), (F2F), (PPE).       Interventional Therapies  Risk  Complexity Considerations:   Estimated body mass index is 23.13 kg/m as calculated from the following:   Height as of this encounter: 6\' 4"  (1.93 m).   Weight as of this encounter: 190 lb (86.2 kg). WNL      Planned  Pending:   Diagnostic  bilateral cervical facet MBB #2    Under consideration:   Diagnostic bilateral cervical facet MBB #2    Completed:   Diagnostic right cervical facet MBB x1 (07/26/2021) (100/100/50/50)    Therapeutic  Palliative (PRN) options:   None established     Recent Visits Date Type Provider Dept  08/14/21 Office Visit Milinda Pointer, MD [facility]  07/26/21 Procedure visit Milinda Pointer, MD [facility]  07/09/21 Office Visit Milinda Pointer, MD [facility]  Showing recent visits within past 90 days and meeting all other requirements Today's Visits Date Type Provider Dept  09/11/21 Procedure visit Milinda Pointer, MD [facility]  Showing today's visits and meeting all other requirements Future Appointments Date Type Provider Dept  09/25/21 Appointment Milinda Pointer, MD [facility]  Showing future appointments within next 90 days and meeting all other requirements  Disposition: Discharge home  Discharge (Date  Time): 09/11/2021; 1030 hrs.   Primary Care Physician: Joseph Art, MD Location: [facility] Note by: Gaspar Cola, MD Date: 09/11/2021; Time: 11:55 AM  Disclaimer:  Medicine is not an Chief Strategy Officer. The only guarantee in medicine is that nothing is guaranteed. It is important to note that the decision to proceed with this intervention was based on the information  collected from the patient. The Data and conclusions were drawn from the patient's questionnaire, the interview, and the physical examination. Because the information was provided in large part by the patient, it cannot be guaranteed that it has not been purposely or unconsciously manipulated. Every effort has been made to obtain as much relevant data as possible for this evaluation. It is important to note that the conclusions that lead to this procedure are derived in large part from the available data. Always take  into account that the treatment will also be dependent on availability of resources and existing treatment guidelines, considered by other Pain Management Practitioners as being common knowledge and practice, at the time of the intervention. For Medico-Legal purposes, it is also important to point out that variation in procedural techniques and pharmacological choices are the acceptable norm. The indications, contraindications, technique, and results of the above procedure should only be interpreted and judged by a Board-Certified Interventional Pain Specialist with extensive familiarity and expertise in the same exact procedure and technique.

## 2021-09-11 NOTE — Progress Notes (Signed)
Safety precautions to be maintained throughout the outpatient stay will include: orient to surroundings, keep bed in low position, maintain call bell within reach at all times, provide assistance with transfer out of bed and ambulation.  

## 2021-09-12 ENCOUNTER — Telehealth: Payer: Self-pay

## 2021-09-12 NOTE — Telephone Encounter (Signed)
Post procedure phone call. Patient states he is doing well.  

## 2021-09-25 ENCOUNTER — Encounter: Payer: Self-pay | Admitting: Pain Medicine

## 2021-09-25 ENCOUNTER — Ambulatory Visit: Payer: No Typology Code available for payment source | Attending: Pain Medicine | Admitting: Pain Medicine

## 2021-09-25 VITALS — BP 131/85 | HR 52 | Temp 97.2°F | Ht 74.0 in | Wt 190.0 lb

## 2021-09-25 DIAGNOSIS — M5030 Other cervical disc degeneration, unspecified cervical region: Secondary | ICD-10-CM | POA: Diagnosis present

## 2021-09-25 DIAGNOSIS — R937 Abnormal findings on diagnostic imaging of other parts of musculoskeletal system: Secondary | ICD-10-CM | POA: Insufficient documentation

## 2021-09-25 DIAGNOSIS — M542 Cervicalgia: Secondary | ICD-10-CM | POA: Insufficient documentation

## 2021-09-25 DIAGNOSIS — M47812 Spondylosis without myelopathy or radiculopathy, cervical region: Secondary | ICD-10-CM | POA: Insufficient documentation

## 2021-09-25 DIAGNOSIS — G4486 Cervicogenic headache: Secondary | ICD-10-CM | POA: Diagnosis present

## 2021-09-25 DIAGNOSIS — M5382 Other specified dorsopathies, cervical region: Secondary | ICD-10-CM | POA: Insufficient documentation

## 2021-09-25 DIAGNOSIS — M62838 Other muscle spasm: Secondary | ICD-10-CM | POA: Diagnosis present

## 2021-09-25 NOTE — Patient Instructions (Signed)
______________________________________________________________________  Preparing for Procedure with Sedation  NOTICE: Due to recent regulatory changes, starting on August 14, 2020, procedures requiring intravenous (IV) sedation will no longer be performed at the [facility].  These types of procedures are required to be performed at [facility] ambulatory surgery facility.  We are very sorry for the inconvenience.  Procedure appointments are limited to planned procedures: No Prescription Refills. No disability issues will be discussed. No medication changes will be discussed.  Instructions: Oral Intake: Do not eat or drink anything for at least 8 hours prior to your procedure. (Exception: Blood Pressure Medication. See below.) Transportation: A driver is required. You may not drive yourself after the procedure. Blood Pressure Medicine: Do not forget to take your blood pressure medicine with a sip of water the morning of the procedure. If your Diastolic (lower reading) is above 100 mmHg, elective cases will be cancelled/rescheduled. Blood thinners: These will need to be stopped for procedures. Notify our staff if you are taking any blood thinners. Depending on which one you take, there will be specific instructions on how and when to stop it. Diabetics on insulin: Notify the staff so that you can be scheduled 1st case in the morning. If your diabetes requires high dose insulin, take only  of your normal insulin dose the morning of the procedure and notify the staff that you have done so. Preventing infections: Shower with an antibacterial soap the morning of your procedure. Build-up your immune system: Take 1000 mg of Vitamin C with every meal (3 times a day) the day prior to your procedure. Antibiotics: Inform the staff if you have a condition or reason that requires you to take antibiotics before dental procedures. Pregnancy: If you are pregnant, call and cancel the procedure. Sickness: If  you have a cold, fever, or any active infections, call and cancel the procedure. Arrival: You must be in the facility at least 30 minutes prior to your scheduled procedure. Children: Do not bring children with you. Dress appropriately: There is always the possibility that your clothing may get soiled. Valuables: Do not bring any jewelry or valuables.  Reasons to call and reschedule or cancel your procedure: (Following these recommendations will minimize the risk of a serious complication.) Surgeries: Avoid having procedures within 2 weeks of any surgery. (Avoid for 2 weeks before or after any surgery). Flu Shots: Avoid having procedures within 2 weeks of a flu shots. (Avoid for 2 weeks before or after immunizations). Barium: Avoid having a procedure within 7-10 days after having had a radiological study involving the use of radiological contrast. (Myelograms, Barium swallow or enema study). Heart attacks: Avoid any elective procedures or surgeries for the initial 6 months after a "Myocardial Infarction" (Heart Attack). Blood thinners: It is imperative that you stop these medications before procedures. Let us know if you if you take any blood thinner.  Infection: Avoid procedures during or within two weeks of an infection (including chest colds or gastrointestinal problems). Symptoms associated with infections include: Localized redness, fever, chills, night sweats or profuse sweating, burning sensation when voiding, cough, congestion, stuffiness, runny nose, sore throat, diarrhea, nausea, vomiting, cold or Flu symptoms, recent or current infections. It is specially important if the infection is over the area that we intend to treat. Heart and lung problems: Symptoms that may suggest an active cardiopulmonary problem include: cough, chest pain, breathing difficulties or shortness of breath, dizziness, ankle swelling, uncontrolled high or unusually low blood pressure, and/or palpitations. If you are    experiencing any of these symptoms, cancel your procedure and contact your primary care physician for an evaluation.  Remember:  Regular Business hours are:  Monday to Thursday 8:00 AM to 4:00 PM  Provider's Schedule: Ernesto Zukowski, MD:  Procedure days: Tuesday and Thursday 7:30 AM to 4:00 PM  Bilal Lateef, MD:  Procedure days: Monday and Wednesday 7:30 AM to 4:00 PM ______________________________________________________________________  ____________________________________________________________________________________________  General Risks and Possible Complications  Patient Responsibilities: It is important that you read this as it is part of your informed consent. It is our duty to inform you of the risks and possible complications associated with treatments offered to you. It is your responsibility as a patient to read this and to ask questions about anything that is not clear or that you believe was not covered in this document.  Patient's Rights: You have the right to refuse treatment. You also have the right to change your mind, even after initially having agreed to have the treatment done. However, under this last option, if you wait until the last second to change your mind, you may be charged for the materials used up to that point.  Introduction: Medicine is not an exact science. Everything in Medicine, including the lack of treatment(s), carries the potential for danger, harm, or loss (which is by definition: Risk). In Medicine, a complication is a secondary problem, condition, or disease that can aggravate an already existing one. All treatments carry the risk of possible complications. The fact that a side effects or complications occurs, does not imply that the treatment was conducted incorrectly. It must be clearly understood that these can happen even when everything is done following the highest safety standards.  No treatment: You can choose not to proceed with the  proposed treatment alternative. The "PRO(s)" would include: avoiding the risk of complications associated with the therapy. The "CON(s)" would include: not getting any of the treatment benefits. These benefits fall under one of three categories: diagnostic; therapeutic; and/or palliative. Diagnostic benefits include: getting information which can ultimately lead to improvement of the disease or symptom(s). Therapeutic benefits are those associated with the successful treatment of the disease. Finally, palliative benefits are those related to the decrease of the primary symptoms, without necessarily curing the condition (example: decreasing the pain from a flare-up of a chronic condition, such as incurable terminal cancer).  General Risks and Complications: These are associated to most interventional treatments. They can occur alone, or in combination. They fall under one of the following six (6) categories: no benefit or worsening of symptoms; bleeding; infection; nerve damage; allergic reactions; and/or death. No benefits or worsening of symptoms: In Medicine there are no guarantees, only probabilities. No healthcare provider can ever guarantee that a medical treatment will work, they can only state the probability that it may. Furthermore, there is always the possibility that the condition may worsen, either directly, or indirectly, as a consequence of the treatment. Bleeding: This is more common if the patient is taking a blood thinner, either prescription or over the counter (example: Goody Powders, Fish oil, Aspirin, Garlic, etc.), or if suffering a condition associated with impaired coagulation (example: Hemophilia, cirrhosis of the liver, low platelet counts, etc.). However, even if you do not have one on these, it can still happen. If you have any of these conditions, or take one of these drugs, make sure to notify your treating physician. Infection: This is more common in patients with a compromised  immune system, either due to disease (example:   diabetes, cancer, human immunodeficiency virus [HIV], etc.), or due to medications or treatments (example: therapies used to treat cancer and rheumatological diseases). However, even if you do not have one on these, it can still happen. If you have any of these conditions, or take one of these drugs, make sure to notify your treating physician. Nerve Damage: This is more common when the treatment is an invasive one, but it can also happen with the use of medications, such as those used in the treatment of cancer. The damage can occur to small secondary nerves, or to large primary ones, such as those in the spinal cord and brain. This damage may be temporary or permanent and it may lead to impairments that can range from temporary numbness to permanent paralysis and/or brain death. Allergic Reactions: Any time a substance or material comes in contact with our body, there is the possibility of an allergic reaction. These can range from a mild skin rash (contact dermatitis) to a severe systemic reaction (anaphylactic reaction), which can result in death. Death: In general, any medical intervention can result in death, most of the time due to an unforeseen complication. ____________________________________________________________________________________________  

## 2021-09-25 NOTE — Progress Notes (Signed)
Safety precautions to be maintained throughout the outpatient stay will include: orient to surroundings, keep bed in low position, maintain call bell within reach at all times, provide assistance with transfer out of bed and ambulation.  

## 2021-09-25 NOTE — Progress Notes (Addendum)
PROVIDER NOTE: Information contained herein reflects review and annotations entered in association with encounter. Interpretation of such information and data should be left to medically-trained personnel. Information provided to patient can be located elsewhere in the medical record under "Patient Instructions". Document created using STT-dictation technology, any transcriptional errors that may result from process are unintentional.    Patient: Darrell Bailey  Service Category: E/M  Provider: Gaspar Cola, MD  DOB: 14-Feb-1960  DOS: 09/25/2021  Referring Provider: Joseph Art, MD  MRN: [id number]  Specialty: Interventional Pain Management  PCP: Joseph Art, MD  Type: Established Patient  Setting: Ambulatory outpatient    Location: Office  Delivery: Face-to-face     HPI  Darrell Bailey, a 61 y.o. year old male, is here today because of his Cervicalgia [M54.2]. Mr. Rison primary complain today is Neck Pain (lower) Last encounter: My last encounter with him was on 09/11/2021. Pertinent problems: Mr. 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 Pain Assessment: Severity of Chronic pain is reported  as a 8 /10. Location: Neck Left, Right/pain radiaities up his head and down to his shoulder. Onset: More than a month ago. Quality: Aching, Constant, Throbbing, Headache, Nagging. Timing: Constant. Modifying factor(s): meds, procedures. Vitals:  height is '6\' 2"'  (1.88 m) and weight is 190 lb (86.2 kg). His temperature is 97.2 F (36.2 C) (abnormal). His blood pressure is 131/85 and his pulse is 52 (abnormal). His oxygen saturation is 100%.   Reason for encounter: post-procedure evaluation and assessment.  The patient indicated having attained 100% relief of the pain for the duration of the local anesthetic which in fact lasted an additional week after that.  Unfortunately, his cervicogenic headaches have returned.  Today we had a long conversation with regards to his options including bilateral cervical facet radiofrequency ablation.  I have informed the patient of the procedure including the risk and possible complications.  He has indicated that he is interested in proceeding with the radiofrequency ablation starting with the left side.  Post-procedure evaluation   Type: Cervical Facet Medial Branch Block(s) #2  Laterality: Bilateral  Level: C3, C4, C5, C6, & C7 Medial Branch Level(s). Injecting these levels blocks the C3-4, C4-5, C5-6, and C6-7 cervical facet joints.  Imaging: Fluoroscopic guidance Anesthesia: Local anesthesia (1-2% Lidocaine) Anxiolysis: IV Versed 2.0 mg Sedation: Moderate Sedation  Fentanyl 50 mcg .  DOS: 09/11/2021  Performed by: Gaspar Cola, MD  Purpose: Diagnostic/Therapeutic Indications: Cervicalgia (cervical spine axial pain) severe enough to impact quality of life or function. 1. Cervical facet syndrome   2. Spondylosis without myelopathy or radiculopathy, cervical region   3. Cervicalgia   4. Cervicogenic headache   5. DDD (degenerative disc disease), cervical   6. Abnormal MRI, cervical spine (04/18/2021)   7. Encounter for therapeutic procedure     NAS-11 Pain score:   Pre-procedure: 6 /10   Post-procedure:  0-No pain/10      Effectiveness:  Initial hour after procedure: 100 %. Subsequent 4-6 hours post-procedure: 100 %. Analgesia past initial 6 hours: 100 % (last for one week). Ongoing improvement:  Analgesic: The patient indicated having attained 100% relief of the pain for the duration of the local anesthetic followed by an additional 1 week of 100% relief.  Unfortunately, after that the pain started coming back and currently he is having again his cervicogenic headaches. Function: Somewhat improved ROM: Somewhat improved  Pharmacotherapy Assessment  Analgesic: None MME/day: 0 mg/day   Monitoring: [place] PMP: PDMP reviewed during this encounter.       Pharmacotherapy: No side-effects or adverse reactions reported. Compliance: No problems identified. Effectiveness: Clinically acceptable.  Chauncey Fischer, RN  09/25/2021  3:22 PM  Signed Safety precautions to be maintained throughout the outpatient stay will include: orient to surroundings, keep bed in low position, maintain call bell within reach at all times, provide assistance with transfer out of bed and ambulation.     No results found for: "CBDTHCR" No results found for: "D8THCCBX" No results found for: "D9THCCBX"  UDS:  No results found for: "SUMMARY"    ROS  Constitutional: Denies any fever or chills Gastrointestinal: No reported hemesis, hematochezia, vomiting, or acute GI distress Musculoskeletal: Denies any acute onset joint swelling, redness, loss of ROM, or weakness Neurological: No reported episodes of acute onset apraxia, aphasia, dysarthria, agnosia, amnesia, paralysis, loss of coordination, or loss of consciousness  Medication Review  ARIPiprazole, amLODipine, loratadine, mirtazapine, prazosin, and sildenafil  History Review  Allergy: Mr. Repass is allergic to tape. Drug: Mr. Odoherty  reports current drug use. Drug: Marijuana. Alcohol:  reports  current alcohol use. Tobacco:  reports that he has quit smoking. His smoking use included cigarettes. He has never used smokeless tobacco. Social: Mr. Whitwell  reports that he has quit smoking. His smoking use included cigarettes. He has never used smokeless tobacco. He reports current alcohol use. He reports current drug use. Drug: Marijuana. Medical:  has a past medical history of ADHD (attention deficit hyperactivity disorder), GERD (gastroesophageal reflux disease), Hypertension, Pneumonia, and Pulmonary embolism ([place]). Surgical: Mr. Khachatryan  has a past surgical history that includes Chest tube insertion. Family: family history is not on file.  Laboratory Chemistry Profile   Renal Lab Results  Component Value Date   BUN 16 06/10/2019   CREATININE 1.62 (H) 06/10/2019   GFRAA 53 (L) 06/10/2019   GFRNONAA 46 (L) 06/10/2019    Hepatic Lab Results  Component Value Date   AST 39 (H) 03/12/2008   ALT 14 03/12/2008   ALBUMIN 5.0 03/12/2008   ALKPHOS 85 03/12/2008   LIPASE 48 03/12/2008    Electrolytes Lab Results  Component Value Date   NA 140 06/10/2019   K 4.2 06/10/2019   CL 107 06/10/2019   CALCIUM 8.9 06/10/2019   MG 1.9 04/25/2008    Bone No results found for: "VD25OH", "VD125OH2TOT", "OV5643PI9", "JJ8841YS0", "25OHVITD1", "25OHVITD2", "25OHVITD3", "TESTOFREE", "TESTOSTERONE"  Inflammation (CRP: Acute Phase) (ESR: Chronic Phase) No results found for: "CRP", "ESRSEDRATE", "LATICACIDVEN"       Note: Above Lab results reviewed.  Recent Imaging Review  DG PAIN CLINIC C-ARM 1-60 MIN NO REPORT Fluoro was used, but no Radiologist interpretation will be provided.  Please refer to "NOTES" tab for provider progress note. Note: Reviewed        Physical Exam  General appearance: Well nourished, well developed, and well hydrated. In no apparent acute distress Mental status:  Alert, oriented x 3 (person, place, & time)       Respiratory: No evidence of acute respiratory  distress Eyes: PERLA Vitals: BP 131/85   Pulse (!) 52   Temp (!) 97.2 F (36.2 C)   Ht '6\' 2"'  (1.88 m)   Wt 190 lb (86.2 kg)   SpO2 100%   BMI 24.39 kg/m  BMI: Estimated body mass index is 24.39 kg/m as calculated from the following:   Height as of this encounter: '6\' 2"'  (1.88 m).   Weight as of this encounter: 190 lb (86.2 kg). Ideal: Ideal body weight: 82.2 kg (181 lb 3.5 oz) Adjusted ideal body weight: 83.8 kg (184 lb 11.7 oz)  Assessment   Diagnosis Status  1. Cervicalgia   2. Cervical facet syndrome   3. Cervicogenic headache   4. DDD (degenerative disc disease), cervical   5. Impaired range of motion of cervical spine   6. Painful cervical range of motion   7. Spasm of cervical paraspinous muscle   8. Abnormal MRI, cervical spine (04/18/2021)    Controlled Controlled Controlled   Updated Problems: No problems updated.  Plan of Care  Problem-specific:  No problem-specific Assessment & Plan notes found for this encounter.  Mr. CHASTON BRADBURN has a current medication list which includes the following long-term medication(s): amlodipine, aripiprazole, loratadine, mirtazapine, prazosin, and sildenafil.  Pharmacotherapy (Medications Ordered): No orders of the defined types were placed in this encounter.  Orders:  Orders Placed This Encounter  Procedures   Radiofrequency,Cervical    Standing Status:   Future    Standing Expiration Date:   01/25/2022    Scheduling Instructions:     Side(s): Left-sided     Level(s): C3, C4, C5, C6, & C7 Medial Branch Nerve(s)     Sedation: With Sedation.     Scheduling Timeframe: As soon as pre-approved    Order Specific Question:   Where will this procedure be performed?    Answer:   [facility]   Follow-up plan:   Return for (81mn), procedure (ECT): (L) C-FCT RFA #1.     Interventional Therapies  Risk  Complexity Considerations:   Estimated body mass index is 23.13 kg/m as calculated from the following:    Height as of this encounter: '6\' 4"'  (1.93 m).   Weight as of this encounter: 190 lb (86.2 kg). WNL        Planned  Pending:   Diagnostic left cervical facet RFA #1    Under consideration:   Diagnostic bilateral cervical facet RFA #1 (starting with the left side)    Completed:   Diagnostic right cervical facet MBB x2 (09/11/2021) (100/100/100x1wk/50)    Therapeutic  Palliative (PRN) options:   None established    Recent Visits Date Type Provider Dept  09/25/21 Office Visit NMilinda Pointer MD [facility]  09/11/21 Procedure visit NMilinda Pointer MD [facility]  08/14/21 Office Visit NMilinda Pointer MD [facility]  07/26/21 Procedure visit NMilinda Pointer MD [facility]  Showing recent visits within past 90 days and meeting all other requirements Today's Visits Date Type Provider Dept  10/09/21 Appointment NMilinda Pointer MD [facility]  Showing today's visits and meeting all other requirements Future Appointments No visits were found meeting these conditions. Showing future appointments within next 90 days and meeting all other requirements  I discussed the assessment and treatment plan with the patient. The patient was provided an opportunity to ask questions and all were answered.  The patient agreed with the plan and demonstrated an understanding of the instructions.  Patient advised to call back or seek an in-person evaluation if the symptoms or condition worsens.  Duration of encounter: 35 minutes.  Total time on encounter, as per AMA guidelines included both the face-to-face and non-face-to-face time personally spent by the physician and/or other qualified health care professional(s) on the day of the encounter (includes time in activities that require the physician or other qualified health care professional and does not include time in activities normally performed by clinical staff). Physician's time  may include the following activities when performed: preparing to see the patient (eg, review of tests, pre-charting review of records) obtaining and/or reviewing separately obtained history performing a medically appropriate examination and/or evaluation counseling and educating the patient/family/caregiver ordering medications, tests, or procedures referring and communicating with other health care professionals (when not separately reported) documenting clinical information in the electronic or other health record independently interpreting results (not separately reported) and communicating results to the patient/ family/caregiver care coordination (not separately reported)  Note by: Gaspar Cola, MD Date: 09/25/2021; Time: 5:35 AM

## 2021-10-09 ENCOUNTER — Ambulatory Visit: Payer: No Typology Code available for payment source | Attending: Pain Medicine | Admitting: Pain Medicine

## 2021-10-09 ENCOUNTER — Ambulatory Visit
Admission: RE | Admit: 2021-10-09 | Discharge: 2021-10-09 | Disposition: A | Discharge status: Home / Self Care | Payer: No Typology Code available for payment source | Source: Ambulatory Visit | Attending: Pain Medicine | Admitting: Pain Medicine

## 2021-10-09 ENCOUNTER — Telehealth: Payer: Self-pay | Admitting: Pain Medicine

## 2021-10-09 ENCOUNTER — Encounter: Payer: Self-pay | Admitting: Pain Medicine

## 2021-10-09 VITALS — BP 138/89 | HR 48 | Temp 98.1°F | Resp 16 | Ht 74.0 in | Wt 190.0 lb

## 2021-10-09 DIAGNOSIS — R001 Bradycardia, unspecified: Secondary | ICD-10-CM | POA: Diagnosis present

## 2021-10-09 DIAGNOSIS — M62838 Other muscle spasm: Secondary | ICD-10-CM | POA: Insufficient documentation

## 2021-10-09 DIAGNOSIS — G8918 Other acute postprocedural pain: Secondary | ICD-10-CM | POA: Diagnosis present

## 2021-10-09 DIAGNOSIS — M47812 Spondylosis without myelopathy or radiculopathy, cervical region: Secondary | ICD-10-CM | POA: Diagnosis present

## 2021-10-09 DIAGNOSIS — M5030 Other cervical disc degeneration, unspecified cervical region: Secondary | ICD-10-CM | POA: Diagnosis present

## 2021-10-09 DIAGNOSIS — G4486 Cervicogenic headache: Secondary | ICD-10-CM | POA: Insufficient documentation

## 2021-10-09 DIAGNOSIS — M542 Cervicalgia: Secondary | ICD-10-CM | POA: Diagnosis present

## 2021-10-09 MED ORDER — HYDROCODONE-ACETAMINOPHEN 5-325 MG PO TABS: 1.0000 | ORAL_TABLET | Freq: Three times a day (TID) | ORAL | 0 refills | Status: AC | PRN

## 2021-10-09 MED ORDER — GLYCOPYRROLATE 0.2 MG/ML IJ SOLN
0.2000 mg | Freq: Once | INTRAMUSCULAR | Status: AC
  Administered 2021-10-09: 0.2 mg via INTRAVENOUS

## 2021-10-09 MED ORDER — DEXAMETHASONE SODIUM PHOSPHATE 10 MG/ML IJ SOLN
10.0000 mg | Freq: Once | INTRAMUSCULAR | Status: AC
  Administered 2021-10-09: 10 mg

## 2021-10-09 MED ORDER — LIDOCAINE HCL 2 % IJ SOLN
20.0000 mL | Freq: Once | INTRAMUSCULAR | Status: AC
  Administered 2021-10-09: 400 mg

## 2021-10-09 MED ORDER — MIDAZOLAM HCL 5 MG/5ML IJ SOLN
INTRAMUSCULAR | Status: AC
  Filled 2021-10-09: qty 5

## 2021-10-09 MED ORDER — ROPIVACAINE HCL 2 MG/ML IJ SOLN
9.0000 mL | Freq: Once | INTRAMUSCULAR | Status: AC
  Administered 2021-10-09: 9 mL via PERINEURAL

## 2021-10-09 MED ORDER — LIDOCAINE HCL 2 % IJ SOLN
INTRAMUSCULAR | Status: AC
  Filled 2021-10-09: qty 20

## 2021-10-09 MED ORDER — ROPIVACAINE HCL 2 MG/ML IJ SOLN
INTRAMUSCULAR | Status: AC
  Filled 2021-10-09: qty 20

## 2021-10-09 MED ORDER — PENTAFLUOROPROP-TETRAFLUOROETH EX AERO
INHALATION_SPRAY | Freq: Once | CUTANEOUS | Status: AC
  Administered 2021-10-09: 30 via TOPICAL
  Filled 2021-10-09: qty 116

## 2021-10-09 MED ORDER — FENTANYL CITRATE (PF) 100 MCG/2ML IJ SOLN
INTRAMUSCULAR | Status: AC
  Filled 2021-10-09: qty 2

## 2021-10-09 MED ORDER — MIDAZOLAM HCL 5 MG/5ML IJ SOLN
0.5000 mg | Freq: Once | INTRAMUSCULAR | Status: AC
  Administered 2021-10-09: 2 mg via INTRAVENOUS

## 2021-10-09 MED ORDER — HYDROCODONE-ACETAMINOPHEN 5-325 MG PO TABS: 1.0000 | ORAL_TABLET | Freq: Three times a day (TID) | ORAL | 0 refills | Status: DC | PRN

## 2021-10-09 MED ORDER — LACTATED RINGERS IV SOLN: Freq: Once | INTRAVENOUS | Status: AC

## 2021-10-09 MED ORDER — FENTANYL CITRATE (PF) 100 MCG/2ML IJ SOLN
25.0000 ug | INTRAMUSCULAR | Status: DC | PRN
  Administered 2021-10-09: 50 ug via INTRAVENOUS

## 2021-10-09 MED ORDER — DEXAMETHASONE SODIUM PHOSPHATE 10 MG/ML IJ SOLN
INTRAMUSCULAR | Status: AC
  Filled 2021-10-09: qty 1

## 2021-10-09 NOTE — Telephone Encounter (Signed)
Pharmacy has an question about patient prescription. Please give pharmacy a call. The ext [phone]. Thanks

## 2021-10-09 NOTE — Progress Notes (Signed)
PROVIDER NOTE: Interpretation of information contained herein should be left to medically-trained personnel. Specific patient instructions are provided elsewhere under "Patient Instructions" section of medical record. This document was created in part using STT-dictation technology, any transcriptional errors that may result from this process are unintentional.  Patient: Darrell Bailey Type: Established DOB: [birth year] MRN: [id number] PCP: Bedelia Person, MD  Service: Procedure DOS: 10/09/2021 Setting: Ambulatory Location: Ambulatory outpatient facility Delivery: Face-to-face Provider: Oswaldo Done, MD Specialty: Interventional Pain Management Specialty designation: 09 Location: Outpatient facility Ref. Prov.: Bedelia Person, MD    Primary Reason for Visit: Interventional Pain Management Treatment. CC: Neck Pain (left)   Procedure:               Type: Cervical Facet, Medial Branch Radiofrequency Ablation (RFA)  #1  Laterality: Left (-LT)  Level: C3, C4, C5, C6, & C7 Medial Branch Level(s). Lesioning of these levels should completely denervate the C3-4, C4-5, C5-6, and the C6-7 cervical facet joints.  Imaging: Fluoroscopy-guided         Anesthesia: Local anesthesia (1-2% Lidocaine) Anxiolysis: IV Versed 2.0 mg Sedation: Moderate Sedation Fentanyl 1 mL (50 mcg) DOS: 10/09/2021  Performed by: Oswaldo Done, MD  Purpose: Therapeutic/Palliative Indications: Chronic neck pain (cervicalgia) severe enough to impact quality of life or function. Rationale (medical necessity): procedure needed and proper for the diagnosis and/or treatment of Mr. Mikes medical symptoms and needs. Indications: 1. Cervical facet syndrome (Bilateral) (L>R)   2. Spondylosis without myelopathy or radiculopathy, cervical region   3. Cervicalgia (Bilateral) (L>R)   4. Cervicogenic headache (Bilateral) (L>R)   5. DDD (degenerative disc disease), cervical   6. Spasm of cervical paraspinous muscle    7. Painful cervical range of motion    Mr. Hevia has been dealing with the above chronic pain for longer than three months and has either failed to respond, was unable to tolerate, or simply did not get enough benefit from other more conservative therapies including, but not limited to: 1. Over-the-counter medications 2. Anti-inflammatory medications 3. Muscle relaxants 4. Membrane stabilizers 5. Opioids 6. Physical therapy and/or chiropractic manipulation 7. Modalities (Heat, ice, etc.) 8. Invasive techniques such as nerve blocks. Mr. Stock has attained more than 50% relief of the pain from a series of diagnostic injections conducted in separate occasions.  Pain Score: Pre-procedure: 7 /10 Post-procedure: 0-No pain/10    Target: Cervical medial nerve  Location: Posterolateral aspect of the waist of the cervical articular pillar. Region: Cervical  Approach: Paramedial  Type of procedure: Radiofrequency Neurolytic Ablation   Position / Prep / Materials:  Position: Prone with head of the table was raised to facilitate breathing.  Prep solution: DuraPrep (Iodine Povacrylex [0.7% available iodine] and Isopropyl Alcohol, 74% w/w) Prep Area: Entire Posterior Cervical Region  Materials:  Tray:  RFA (Radiofrequency) tray Needle(s):  Type: RFA (Teflon-coated radiofrequency ablation needles)  Gauge (G): 22  Length: Short (5cm)  Qty: 5  Pre-op H&P Assessment:  Mr. Ku is a 61 y.o. (year old), male patient, seen today for interventional treatment. He  has a past surgical history that includes Chest tube insertion. Mr. Robarge has a current medication list which includes the following prescription(s): amlodipine, aripiprazole, hydrocodone-acetaminophen, [START ON 10/16/2021] hydrocodone-acetaminophen, loratadine, mirtazapine, prazosin, and sildenafil, and the following Facility-Administered Medications: fentanyl. His primarily concern today is the Neck Pain (left)  Initial Vital  Signs:  Pulse/HCG Rate: (!) 48ECG Heart Rate: (!) 43 (SB) Temp: 98.1 F (36.7 C) Resp: 18 BP: (!) 155/78  SpO2: 99 %  BMI: Estimated body mass index is 24.39 kg/m as calculated from the following:   Height as of this encounter: VT$  (1.88 m).   Weight as of this encounter: 190 lb (86.2 kg).  Risk Assessment: Allergies: Reviewed. He is allergic to tape.  Allergy Precautions: None required Coagulopathies: Reviewed. None identified.  Blood-thinner therapy: None at this time Active Infection(s): Reviewed. None identified. Mr. Chahal is afebrile  Site Confirmation: Mr. Marley was asked to confirm the procedure and laterality before marking the site Procedure checklist: Completed Consent: Before the procedure and under the influence of no sedative(s), amnesic(s), or anxiolytics, the patient was informed of the treatment options, risks and possible complications. To fulfill our ethical and legal obligations, as recommended by the American Medical Association's Code of Ethics, I have informed the patient of my clinical impression; the nature and purpose of the treatment or procedure; the risks, benefits, and possible complications of the intervention; the alternatives, including doing nothing; the risk(s) and benefit(s) of the alternative treatment(s) or procedure(s); and the risk(s) and benefit(s) of doing nothing. The patient was provided information about the general risks and possible complications associated with the procedure. These may include, but are not limited to: failure to achieve desired goals, infection, bleeding, organ or nerve damage, allergic reactions, paralysis, and death. In addition, the patient was informed of those risks and complications associated to Spine-related procedures, such as failure to decrease pain; infection (i.e.: Meningitis, epidural or intraspinal abscess); bleeding (i.e.: epidural hematoma, subarachnoid hemorrhage, or any other type of intraspinal or  peri-dural bleeding); organ or nerve damage (i.e.: Any type of peripheral nerve, nerve root, or spinal cord injury) with subsequent damage to sensory, motor, and/or autonomic systems, resulting in permanent pain, numbness, and/or weakness of one or several areas of the body; allergic reactions; (i.e.: anaphylactic reaction); and/or death. Furthermore, the patient was informed of those risks and complications associated with the medications. These include, but are not limited to: allergic reactions (i.e.: anaphylactic or anaphylactoid reaction(s)); adrenal axis suppression; blood sugar elevation that in diabetics may result in ketoacidosis or comma; water retention that in patients with history of congestive heart failure may result in shortness of breath, pulmonary edema, and decompensation with resultant heart failure; weight gain; swelling or edema; medication-induced neural toxicity; particulate matter embolism and blood vessel occlusion with resultant organ, and/or nervous system infarction; and/or aseptic necrosis of one or more joints. Finally, the patient was informed that Medicine is not an exact science; therefore, there is also the possibility of unforeseen or unpredictable risks and/or possible complications that may result in a catastrophic outcome. The patient indicated having understood very clearly. We have given the patient no guarantees and we have made no promises. Enough time was given to the patient to ask questions, all of which were answered to the patient's satisfaction. Mr. Arrazola has indicated that he wanted to continue with the procedure. Attestation: I, the ordering provider, attest that I have discussed with the patient the benefits, risks, side-effects, alternatives, likelihood of achieving goals, and potential problems during recovery for the procedure that I have provided informed consent. Date  Time: 10/09/2021  8:15 AM  Pre-Procedure Preparation:  Monitoring: As per clinic  protocol. Respiration, ETCO2, SpO2, BP, heart rate and rhythm monitor placed and checked for adequate function Safety Precautions: Patient was assessed for positional comfort and pressure points before starting the procedure. Time-out: I initiated and conducted the "Time-out" before starting the procedure, as per protocol. The patient was  asked to participate by confirming the accuracy of the "Time Out" information. Verification of the correct person, site, and procedure were performed and confirmed by me, the nursing staff, and the patient. "Time-out" conducted as per Joint Commission's Universal Protocol (UP.01.01.01). Time: 0855  Description/Narrative of Procedure:          Rationale (medical necessity): procedure needed and proper for the diagnosis and/or treatment of the patient's medical symptoms and needs. Procedural Technique Safety Precautions: Aspiration looking for blood return was conducted prior to all injections. At no point did we inject any substances, as a needle was being advanced. No attempts were made at seeking any paresthesias. Safe injection practices and needle disposal techniques used. Medications properly checked for expiration dates. SDV (single dose vial) medications used. Description of the Procedure: Protocol guidelines were followed. The patient was assisted into a comfortable position. The target area was identified and the area prepped in the usual manner. Skin & deeper tissues infiltrated with local anesthetic. Appropriate amount of time allowed to pass for local anesthetics to take effect. The procedure needles were then advanced to the target area. Proper needle placement secured. Negative aspiration confirmed. Solution injected in intermittent fashion, asking for systemic symptoms every 0.5cc of injectate. The needles were then removed and the area cleansed, making sure to leave some of the prepping solution back to take advantage of its long term bactericidal  properties.  Technical description of procedure:  Laterality: Left Radiofrequency Ablation (RFA) C3 Medial Branch Nerve RFA: The target area for the C3 dorsal medial articular branch is the lateral concave waist of the articular pillar of C3. Under fluoroscopic guidance, a Radiofrequency needle was inserted until contact was made with os over the postero-lateral aspect of the articular pillar of C3 (target area). Sensory and motor testing was conducted to properly adjust the position of the needle. Once satisfactory placement of the needle was achieved, the numbing solution was slowly injected after negative aspiration for blood. 2.0 mL of the nerve block solution was injected without difficulty or complication. After waiting for at least 3 minutes, the ablation was performed. Once completed, the needle was removed intact. C4 Medial Branch Nerve RFA: The target area for the C4 dorsal medial articular branch is the lateral concave waist of the articular pillar of C4. Under fluoroscopic guidance, a Radiofrequency needle was inserted until contact was made with os over the postero-lateral aspect of the articular pillar of C4 (target area). Sensory and motor testing was conducted to properly adjust the position of the needle. Once satisfactory placement of the needle was achieved, the numbing solution was slowly injected after negative aspiration for blood. 2.0 mL of the nerve block solution was injected without difficulty or complication. After waiting for at least 3 minutes, the ablation was performed. Once completed, the needle was removed intact. C5 Medial Branch Nerve RFA: The target area for the C5 dorsal medial articular branch is the lateral concave waist of the articular pillar of C5. Under fluoroscopic guidance, a Radiofrequency needle was inserted until contact was made with os over the postero-lateral aspect of the articular pillar of C5 (target area). Sensory and motor testing was conducted to  properly adjust the position of the needle. Once satisfactory placement of the needle was achieved, the numbing solution was slowly injected after negative aspiration for blood. 2.0 mL of the nerve block solution was injected without difficulty or complication. After waiting for at least 3 minutes, the ablation was performed. Once completed, the needle was removed  intact. C6 Medial Branch Nerve RFA: The target area for the C6 dorsal medial articular branch is the lateral concave waist of the articular pillar of C6. Under fluoroscopic guidance, a Radiofrequency needle was inserted until contact was made with os over the postero-lateral aspect of the articular pillar of C6 (target area). Sensory and motor testing was conducted to properly adjust the position of the needle. Once satisfactory placement of the needle was achieved, the numbing solution was slowly injected after negative aspiration for blood. 2.0 mL of the nerve block solution was injected without difficulty or complication. After waiting for at least 3 minutes, the ablation was performed. Once completed, the needle was removed intact. C7 Medial Branch Nerve RFA: The target for the C7 dorsal medial articular branch lies on the superior-medial tip of the C7 transverse process. Under fluoroscopic guidance, a Radiofrequency needle was inserted until contact was made with os over the postero-lateral aspect of the articular pillar of C7 (target area). Sensory and motor testing was conducted to properly adjust the position of the needle. Once satisfactory placement of the needle was achieved, the numbing solution was slowly injected after negative aspiration for blood. 2.0 mL of the nerve block solution was injected without difficulty or complication. After waiting for at least 3 minutes, the ablation was performed. Once completed, the needle was removed intact. Radiofrequency lesioning (ablation):  Radiofrequency Generator: Medtronic AccurianTM AG 1000 RF  Generator Sensory Stimulation Parameters: 50 Hz was used to locate & identify the nerve, making sure that the needle was positioned such that there was no sensory stimulation below 0.3 V or above 0.7 V. Motor Stimulation Parameters: 2 Hz was used to evaluate the motor component. Care was taken not to lesion any nerves that demonstrated motor stimulation of the lower extremities at an output of less than 2.5 times that of the sensory threshold, or a maximum of 2.0 V. Lesioning Technique Parameters: Standard Radiofrequency settings. (Not bipolar or pulsed.) Temperature Settings: 80 degrees C Lesioning time: 60 seconds Intra-operative Compliance: Compliant   Once the entire procedure was completed, the treated area was cleaned, making sure to leave some of the prepping solution back to take advantage of its long term bactericidal properties. Intra-operative Compliance: Compliant  Anatomy Reference Guide:           Vitals:   10/09/21 0925 10/09/21 0932 10/09/21 0942 10/09/21 0951  BP: (!) 157/101 (!) 135/103 (!) 134/90 138/89  Pulse:      Resp: 15 16 15 16   Temp:      SpO2: 100% 98% 97% 98%  Weight:      Height:         Start Time: 0855 hrs. End Time: 0925 hrs.  Imaging Guidance (Spinal):          Type of Imaging Technique: Fluoroscopy Guidance (Spinal) Indication(s): Assistance in needle guidance and placement for procedures requiring needle placement in or near specific anatomical locations not easily accessible without such assistance. Exposure Time: Please see nurses notes. Contrast: None used. Fluoroscopic Guidance: I was personally present during the use of fluoroscopy. "Tunnel Vision Technique" used to obtain the best possible view of the target area. Parallax error corrected before commencing the procedure. "Direction-depth-direction" technique used to introduce the needle under continuous pulsed fluoroscopy. Once target was reached, antero-posterior, oblique, and lateral  fluoroscopic projection used confirm needle placement in all planes. Images permanently stored in EMR. Interpretation: No contrast injected. I personally interpreted the imaging intraoperatively. Adequate needle placement confirmed in multiple planes.  Permanent images saved into the patient's record.  Post-operative Assessment:  Post-procedure Vital Signs:  Pulse/HCG Rate: (!) 48(!) 49 Temp: 98.1 F (36.7 C) Resp: 16 BP: 138/89 SpO2: 98 %  EBL: None  Complications: No immediate post-treatment complications observed by team, or reported by patient.  Note: The patient tolerated the entire procedure well. A repeat set of vitals were taken after the procedure and the patient was kept under observation following institutional policy, for this type of procedure. Post-procedural neurological assessment was performed, showing return to baseline, prior to discharge. The patient was provided with post-procedure discharge instructions, including a section on how to identify potential problems. Should any problems arise concerning this procedure, the patient was given instructions to immediately contact us, at any time, without hesitation. In any case, we plan to contact the patient by telephone for a follow-up status report regarding this interventional procedure.  Comments:  No additional relevant information.  Plan of Care  Orders:  Orders Placed This Encounter  Procedures   Radiofrequency,Cervical    Scheduling Instructions:     Side(s): Left-sided     Level(s): C3, C4, C5, C6, & C7 Medial Branch Nerve(s)     Sedation: With Sedation.     Timeframe: Today    Order Specific Question:   Where will this procedure be performed?    Answer:   [facility]   Radiofrequency,Cervical    Standing Status:   Future    Standing Expiration Date:   02/08/2022    Scheduling Instructions:     Side(s): Right-sided     Level(s): C3, C4, C5, C6, & C7 Medial Branch Nerve(s)     Sedation: With  Sedation.     Scheduling Timeframe: 2 weeks from now    Order Specific Question:   Where will this procedure be performed?    Answer:   [facility]   DG PAIN [facility] C-ARM 1-60 MIN NO REPORT    Intraoperative interpretation by procedural physician at [facility].    Standing Status:   Standing    Number of Occurrences:   1    Order Specific Question:   Reason for exam:    Answer:   Assistance in needle guidance and placement for procedures requiring needle placement in or near specific anatomical locations not easily accessible without such assistance.   Informed Consent Details: Physician/Practitioner Attestation; Transcribe to consent form and obtain patient signature    Nursing Order: Transcribe to consent form and obtain patient signature. Note: Always confirm laterality of pain with Mr. Cathlean CowerBaldwin, before procedure.    Order Specific Question:   Physician/Practitioner attestation of informed consent for procedure/surgical case    Answer:   I, the physician/practitioner, attest that I have discussed with the patient the benefits, risks, side effects, alternatives, likelihood of achieving goals and potential problems during recovery for the procedure that I have provided informed consent.    Order Specific Question:   Procedure    Answer:   Cervical Facet Radiofrequency Ablation    Order Specific Question:   Physician/Practitioner performing the procedure    Answer:   Keyanna Sandefer A. Laban EmperorNaveira, MD    Order Specific Question:   Indication/Reason    Answer:   Neck Pain (Cervicalgia), with our without referred arm pain, due to Facet Joint Arthralgia (Joint Pain) known as Cervical Facet Syndrome, secondary to Cervical, and/or Cervico-thoracic Spondylosis (Arthritis of the Spine), without myelopathy or radiculop   Provide equipment / supplies at bedside    "Radiofrequency  Tray"; Large hemostat (x1); Small hemostat (x1); Towels (x8); 4x4 sterile sponge pack (x1) Needle type:  Teflon-coated Radiofrequency Needle (Disposable  single use) Size: Short Quantity: 5    Standing Status:   Standing    Number of Occurrences:   1    Order Specific Question:   Specify    Answer:   Radiofrequency Tray   Chronic Opioid Analgesic:  None MME/day: 0 mg/day   Medications ordered for procedure: Meds ordered this encounter  Medications   lidocaine (XYLOCAINE) 2 % (with pres) injection 400 mg   pentafluoroprop-tetrafluoroeth (GEBAUERS) aerosol   lactated ringers infusion   midazolam (VERSED) 5 MG/5ML injection 0.5-2 mg    Make sure Flumazenil is available in the pyxis when using this medication. If oversedation occurs, administer 0.2 mg IV over 15 sec. If after 45 sec no response, administer 0.2 mg again over 1 min; may repeat at 1 min intervals; not to exceed 4 doses (1 mg)   fentaNYL (SUBLIMAZE) injection 25-50 mcg    Make sure Narcan is available in the pyxis when using this medication. In the event of respiratory depression (RR< 8/min): Titrate NARCAN (naloxone) in increments of 0.1 to 0.2 mg IV at 2-3 minute intervals, until desired degree of reversal.   ropivacaine (PF) 2 mg/mL (0.2%) (NAROPIN) injection 9 mL   dexamethasone (DECADRON) injection 10 mg   HYDROcodone-acetaminophen (NORCO/VICODIN) 5-325 MG tablet    Sig: Take 1 tablet by mouth every 8 (eight) hours as needed for up to 7 days for severe pain. Must last 7 days.    Dispense:  21 tablet    Refill:  0    For acute post-operative pain. Not to be refilled. Must last 7 days.   HYDROcodone-acetaminophen (NORCO/VICODIN) 5-325 MG tablet    Sig: Take 1 tablet by mouth every 8 (eight) hours as needed for up to 7 days for severe pain. Must last for 7 days.    Dispense:  21 tablet    Refill:  0    For acute post-operative pain. Not to be refilled. Must last 7 days.   glycopyrrolate (ROBINUL) injection 0.2 mg   Medications administered: We administered lidocaine, pentafluoroprop-tetrafluoroeth, lactated ringers,  midazolam, fentaNYL, ropivacaine (PF) 2 mg/mL (0.2%), dexamethasone, and glycopyrrolate.  See the medical record for exact dosing, route, and time of administration.  Follow-up plan:   Return in about 2 weeks (around 10/23/2021) for (71min), procedure (ECT): (R) C-FCT RFA #1.       Interventional Therapies  Risk  Complexity Considerations:   Estimated body mass index is 23.13 kg/m as calculated from the following:   Height as of this encounter: 6\' 4"  (1.93 m).   Weight as of this encounter: 190 lb (86.2 kg). WNL        Planned  Pending:   Diagnostic left cervical facet RFA #1    Under consideration:   Diagnostic bilateral cervical facet RFA #1 (starting with the left side)    Completed:   Diagnostic right cervical facet MBB x2 (09/11/2021) (100/100/100x1wk/50)    Therapeutic  Palliative (PRN) options:   None established     Recent Visits Date Type Provider Dept  09/25/21 Office Visit Milinda Pointer, MD [facility]  09/11/21 Procedure visit Milinda Pointer, MD [facility]  08/14/21 Office Visit Milinda Pointer, MD [facility]  07/26/21 Procedure visit Milinda Pointer, MD [facility]  Showing recent visits within past 90 days and meeting all other requirements Today's Visits Date Type Provider Dept  10/09/21 Procedure visit Delano Metz, MD [facility]  Showing today's visits and meeting all other requirements Future Appointments Date Type Provider Dept  10/23/21 Appointment Delano Metz, MD [facility]  Showing future appointments within next 90 days and meeting all other requirements  Disposition: Discharge home  Discharge (Date  Time): 10/09/2021; 0952 hrs.   Primary Care Physician: Bedelia Person, MD Location: [facility] Note by: Oswaldo Done, MD Date: 10/09/2021; Time: 10:53 AM  Disclaimer:  Medicine is not an Visual merchandiser. The only  guarantee in medicine is that nothing is guaranteed. It is important to note that the decision to proceed with this intervention was based on the information collected from the patient. The Data and conclusions were drawn from the patient's questionnaire, the interview, and the physical examination. Because the information was provided in large part by the patient, it cannot be guaranteed that it has not been purposely or unconsciously manipulated. Every effort has been made to obtain as much relevant data as possible for this evaluation. It is important to note that the conclusions that lead to this procedure are derived in large part from the available data. Always take into account that the treatment will also be dependent on availability of resources and existing treatment guidelines, considered by other Pain Management Practitioners as being common knowledge and practice, at the time of the intervention. For Medico-Legal purposes, it is also important to point out that variation in procedural techniques and pharmacological choices are the acceptable norm. The indications, contraindications, technique, and results of the above procedure should only be interpreted and judged by a Board-Certified Interventional Pain Specialist with extensive familiarity and expertise in the same exact procedure and technique.

## 2021-10-09 NOTE — Progress Notes (Signed)
Safety precautions to be maintained throughout the outpatient stay will include: orient to surroundings, keep bed in low position, maintain call bell within reach at all times, provide assistance with transfer out of bed and ambulation.  

## 2021-10-09 NOTE — Telephone Encounter (Signed)
Pharm states that they have already been Called from [place]

## 2021-10-09 NOTE — Patient Instructions (Addendum)
____________________________________________________________________________________________  Virtual Visits   What is a "Virtual Visit"? It is a healthcare communication encounter (medical visit) that takes place on real time (NOT TEXT or E-MAIL) over the telephone or computer device (desktop, laptop, tablet, smart phone, etc.). It allows for more location flexibility between the patient and the healthcare provider.  Who decides when these types of visits will be used? The physician.  Who is eligible for these types of visits? Only those patients that can be reliably reached over the telephone.  What do you mean by reliably? We do not have time to call everyone multiple times, therefore those that tend to screen calls and then call back later are not suitable candidates for this system. We understand how people are reluctant to pickup on "unknown" calls, therefore, we suggest adding our telephone numbers to your list of "CONTACT(s)". This way, you should be able to readily identify our calls when you receive one. All of our numbers are available below.   Who is not eligible? This option is not available for medication management encounters, specially for controlled substances. Patients on pain medications that fall under the category of controlled substances have to come in for "Face-to-Face" encounters. This is required for mandatory monitoring of these substances. You may be asked to provide a sample for an unannounced urine drug screening test (UDS), and we will need to count your pain pills. Not bringing your pills to be counted may result in no refill. Obviously, neither one of these can be done over the phone.  When will this type of visits be used? You can request a virtual visit whenever you are physically unable to attend a regular appointment. The decision will be made by the physician (or healthcare provider) on a case by case basis.   At what time will I be called? This is an  excellent question. The providers will try to call you whenever they have time available. Do not expect to be called at any specific time. The secretaries will assign you a time for your virtual visit appointment, but this is done simply to keep a list of those patients that need to be called, but not for the purpose of keeping a time schedule. Be advised that the call may come in anytime during the day, between the hours of 8:00 AM and 8::00 PM, depending on provider availability. We do understand that the system is not perfect. If you are unable to be available that day on a moments notice, then request an "in-person" appointment rather than a "virtual visit".  Can I request my medication visits to be "Virtual"? Yes you may request it, but the decision is entirely up to the healthcare provider. Control substances require specific monitoring that requires Face-to-Face encounters. The number of encounters  and the extent of the monitoring is determined on a case by case basis.  Add a new contact to your smart phone and label it "PAIN CLINIC" Under this contact add the following numbers: Main: [phone] (Official Contact Number) Nurses: [phone] (These are outgoing only calling systems. Do not call this number.) Dr. Caly Pellum: [phone] or [phone] (Outgoing calls only. Do not call this number.)  ____________________________________________________________________________________________  ___________________________________________________________________________________________  Post-Radiofrequency (RF) Discharge Instructions  You have just completed a Radiofrequency Neurotomy.  The following instructions will provide you with information and guidelines for self-care upon discharge.  If at any time you have questions or concerns please call your physician. DO NOT DRIVE YOURSELF!!  Instructions: Apply   ice: Fill a plastic sandwich bag with crushed ice. Cover it with a small  towel and apply to injection site. Apply for 15 minutes then remove x 15 minutes. Repeat sequence on day of procedure, until you go to bed. The purpose is to minimize swelling and discomfort after procedure. Apply heat: Apply heat to procedure site starting the day following the procedure. The purpose is to treat any soreness and discomfort from the procedure. Food intake: No eating limitations, unless stipulated above.  Nevertheless, if you have had sedation, you may experience some nausea.  In this case, it may be wise to wait at least two hours prior to resuming regular diet. Physical activities: Keep activities to a minimum for the first 8 hours after the procedure. For the first 24 hours after the procedure, do not drive a motor vehicle,  Operate heavy machinery, power tools, or handle any weapons.  Consider walking with the use of an assistive device or accompanied by an adult for the first 24 hours.  Do not drink alcoholic beverages including beer.  Do not make any important decisions or sign any legal documents. Go home and rest today.  Resume activities tomorrow, as tolerated.  Use caution in moving about as you may experience mild leg weakness.  Use caution in cooking, use of household electrical appliances and climbing steps. Driving: If you have received any sedation, you are not allowed to drive for 24 hours after your procedure. Blood thinner: Restart your blood thinner 6 hours after your procedure. (Only for those taking blood thinners) Insulin: As soon as you can eat, you may resume your normal dosing schedule. (Only for those taking insulin) Medications: May resume pre-procedure medications.  Do not take any drugs, other than what has been prescribed to you. Infection prevention: Keep procedure site clean and dry. Post-procedure Pain Diary: Extremely important that this be done correctly and accurately. Recorded information will be used to determine the next step in treatment. Pain  evaluated is that of treated area only. Do not include pain from an untreated area. Complete every hour, on the hour, for the initial 8 hours. Set an alarm to help you do this part accurately. Do not go to sleep and have it completed later. It will not be accurate. Follow-up appointment: Keep your follow-up appointment after the procedure. Usually 2-6 weeks after radiofrequency. Bring you pain diary. The information collected will be essential for your long-term care.   Expect: From numbing medicine (AKA: Local Anesthetics): Numbness or decrease in pain. Onset: Full effect within 15 minutes of injected. Duration: It will depend on the type of local anesthetic used. On the average, 1 to 8 hours.  From steroids (when added): Decrease in swelling or inflammation. Once inflammation is improved, relief of the pain will follow. Onset of benefits: Depends on the amount of swelling present. The more swelling, the longer it will take for the benefits to be seen. In some cases, up to 10 days. Duration: Steroids will stay in the system x 2 weeks. Duration of benefits will depend on multiple posibilities including persistent irritating factors. From procedure: Some discomfort is to be expected once the numbing medicine wears off. In the case of radiofrequency procedures, this may last as long as 6 weeks. Additional post-procedure pain medication is provided for this. Discomfort is minimized if ice and heat are applied as instructed.  Call if: You experience numbness and weakness that gets worse with time, as opposed to wearing off. He experience any unusual  bleeding, difficulty breathing, or loss of the ability to control your bowel and bladder. (This applies to Spinal procedures only) You experience any redness, swelling, heat, red streaks, elevated temperature, fever, or any other signs of a possible infection.  Emergency Numbers: [place] hours (Monday - Thursday, 8:00 AM - 4:00 PM) (Friday, 9:00  AM - 12:00 Noon): [phone] After hours: [phone] ____________________________________________________________________________________________  ______________________________________________________________________  Preparing for Procedure with Sedation  NOTICE: Due to recent regulatory changes, starting on August 14, 2020, procedures requiring intravenous (IV) sedation will no longer be performed at the [facility].  These types of procedures are required to be performed at [facility] ambulatory surgery facility.  We are very sorry for the inconvenience.  Procedure appointments are limited to planned procedures: No Prescription Refills. No disability issues will be discussed. No medication changes will be discussed.  Instructions: Oral Intake: Do not eat or drink anything for at least 8 hours prior to your procedure. (Exception: Blood Pressure Medication. See below.) Transportation: A driver is required. You may not drive yourself after the procedure. Blood Pressure Medicine: Do not forget to take your blood pressure medicine with a sip of water the morning of the procedure. If your Diastolic (lower reading) is above 100 mmHg, elective cases will be cancelled/rescheduled. Blood thinners: These will need to be stopped for procedures. Notify our staff if you are taking any blood thinners. Depending on which one you take, there will be specific instructions on how and when to stop it. Diabetics on insulin: Notify the staff so that you can be scheduled 1st case in the morning. If your diabetes requires high dose insulin, take only  of your normal insulin dose the morning of the procedure and notify the staff that you have done so. Preventing infections: Shower with an antibacterial soap the morning of your procedure. Build-up your immune system: Take 1000 mg of Vitamin C with every meal (3 times a day) the day prior to your procedure. Antibiotics: Inform the staff if you have a  condition or reason that requires you to take antibiotics before dental procedures. Pregnancy: If you are pregnant, call and cancel the procedure. Sickness: If you have a cold, fever, or any active infections, call and cancel the procedure. Arrival: You must be in the facility at least 30 minutes prior to your scheduled procedure. Children: Do not bring children with you. Dress appropriately: There is always the possibility that your clothing may get soiled. Valuables: Do not bring any jewelry or valuables.  Reasons to call and reschedule or cancel your procedure: (Following these recommendations will minimize the risk of a serious complication.) Surgeries: Avoid having procedures within 2 weeks of any surgery. (Avoid for 2 weeks before or after any surgery). Flu Shots: Avoid having procedures within 2 weeks of a flu shots. (Avoid for 2 weeks before or after immunizations). Barium: Avoid having a procedure within 7-10 days after having had a radiological study involving the use of radiological contrast. (Myelograms, Barium swallow or enema study). Heart attacks: Avoid any elective procedures or surgeries for the initial 6 months after a "Myocardial Infarction" (Heart Attack). Blood thinners: It is imperative that you stop these medications before procedures. Let us know if you if you take any blood thinner.  Infection: Avoid procedures during or within two weeks of an infection (including chest colds or gastrointestinal problems). Symptoms associated with infections include: Localized redness, fever, chills, night sweats or profuse sweating, burning sensation when voiding, cough, congestion, stuffiness, runny  nose, sore throat, diarrhea, nausea, vomiting, cold or Flu symptoms, recent or current infections. It is specially important if the infection is over the area that we intend to treat. Heart and lung problems: Symptoms that may suggest an active cardiopulmonary problem include: cough, chest pain,  breathing difficulties or shortness of breath, dizziness, ankle swelling, uncontrolled high or unusually low blood pressure, and/or palpitations. If you are experiencing any of these symptoms, cancel your procedure and contact your primary care physician for an evaluation.  Remember:  Regular Business hours are:  Monday to Thursday 8:00 AM to 4:00 PM  Provider's Schedule: Delano Metz, MD:  Procedure days: Tuesday and Thursday 7:30 AM to 4:00 PM  Edward Jolly, MD:  Procedure days: Monday and Wednesday 7:30 AM to 4:00 PM ______________________________________________________________________  ____________________________________________________________________________________________  General Risks and Possible Complications  Patient Responsibilities: It is important that you read this as it is part of your informed consent. It is our duty to inform you of the risks and possible complications associated with treatments offered to you. It is your responsibility as a patient to read this and to ask questions about anything that is not clear or that you believe was not covered in this document.  Patient's Rights: You have the right to refuse treatment. You also have the right to change your mind, even after initially having agreed to have the treatment done. However, under this last option, if you wait until the last second to change your mind, you may be charged for the materials used up to that point.  Introduction: Medicine is not an Visual merchandiser. Everything in Medicine, including the lack of treatment(s), carries the potential for danger, harm, or loss (which is by definition: Risk). In Medicine, a complication is a secondary problem, condition, or disease that can aggravate an already existing one. All treatments carry the risk of possible complications. The fact that a side effects or complications occurs, does not imply that the treatment was conducted incorrectly. It must be clearly  understood that these can happen even when everything is done following the highest safety standards.  No treatment: You can choose not to proceed with the proposed treatment alternative. The "PRO(s)" would include: avoiding the risk of complications associated with the therapy. The "CON(s)" would include: not getting any of the treatment benefits. These benefits fall under one of three categories: diagnostic; therapeutic; and/or palliative. Diagnostic benefits include: getting information which can ultimately lead to improvement of the disease or symptom(s). Therapeutic benefits are those associated with the successful treatment of the disease. Finally, palliative benefits are those related to the decrease of the primary symptoms, without necessarily curing the condition (example: decreasing the pain from a flare-up of a chronic condition, such as incurable terminal cancer).  General Risks and Complications: These are associated to most interventional treatments. They can occur alone, or in combination. They fall under one of the following six (6) categories: no benefit or worsening of symptoms; bleeding; infection; nerve damage; allergic reactions; and/or death. No benefits or worsening of symptoms: In Medicine there are no guarantees, only probabilities. No healthcare provider can ever guarantee that a medical treatment will work, they can only state the probability that it may. Furthermore, there is always the possibility that the condition may worsen, either directly, or indirectly, as a consequence of the treatment. Bleeding: This is more common if the patient is taking a blood thinner, either prescription or over the counter (example: Goody Powders, Fish oil, Aspirin, Garlic, etc.), or if suffering  a condition associated with impaired coagulation (example: Hemophilia, cirrhosis of the liver, low platelet counts, etc.). However, even if you do not have one on these, it can still happen. If you have any  of these conditions, or take one of these drugs, make sure to notify your treating physician. Infection: This is more common in patients with a compromised immune system, either due to disease (example: diabetes, cancer, human immunodeficiency virus [HIV], etc.), or due to medications or treatments (example: therapies used to treat cancer and rheumatological diseases). However, even if you do not have one on these, it can still happen. If you have any of these conditions, or take one of these drugs, make sure to notify your treating physician. Nerve Damage: This is more common when the treatment is an invasive one, but it can also happen with the use of medications, such as those used in the treatment of cancer. The damage can occur to small secondary nerves, or to large primary ones, such as those in the spinal cord and brain. This damage may be temporary or permanent and it may lead to impairments that can range from temporary numbness to permanent paralysis and/or brain death. Allergic Reactions: Any time a substance or material comes in contact with our body, there is the possibility of an allergic reaction. These can range from a mild skin rash (contact dermatitis) to a severe systemic reaction (anaphylactic reaction), which can result in death. Death: In general, any medical intervention can result in death, most of the time due to an unforeseen complication. ____________________________________________________________________________________________

## 2021-10-09 NOTE — Progress Notes (Signed)
0855 GLYCOPYROLATE 0.2MG  GIVEN IVP PER MD ORDER.

## 2021-10-10 ENCOUNTER — Telehealth: Payer: Self-pay

## 2021-10-10 NOTE — Telephone Encounter (Signed)
Post procedure phone call. Patient states he is doing well.  

## 2021-10-21 NOTE — Progress Notes (Unsigned)
PROVIDER NOTE: Interpretation of information contained herein should be left to medically-trained personnel. Specific patient instructions are provided elsewhere under "Patient Instructions" section of medical record. This document was created in part using STT-dictation technology, any transcriptional errors that may result from this process are unintentional.  Patient: Darrell Bailey Type: Established DOB: 1960/02/27 MRN: [id number] PCP: Bedelia Person, MD  Service: Procedure DOS: 10/23/2021 Setting: Ambulatory Location: Ambulatory outpatient facility Delivery: Face-to-face Provider: Oswaldo Done, MD Specialty: Interventional Pain Management Specialty designation: 09 Location: Outpatient facility Ref. Prov.: Bedelia Person, MD    Primary Reason for Visit: Interventional Pain Management Treatment. CC: No chief complaint on file.   Procedure:               Type: Cervical Facet, Medial Branch Radiofrequency Ablation (RFA)  #1  Laterality: Right (-RT)  Level: C3, C4, C5, C6, & C7 Medial Branch Level(s). Lesioning of these levels should completely denervate the C3-4, C4-5, C5-6, and the C6-7 cervical facet joints.  Imaging: Fluoroscopy-guided         Anesthesia: Local anesthesia (1-2% Lidocaine) Anxiolysis: IV Versed         Sedation:                         DOS: 10/23/2021  Performed by: Oswaldo Done, MD  Purpose: Therapeutic/Palliative Indications: Chronic neck pain (cervicalgia) severe enough to impact quality of life or function. Rationale (medical necessity): procedure needed and proper for the diagnosis and/or treatment of Mr. Budreau medical symptoms and needs. Indications: No diagnosis found. Mr. Eisenhardt has been dealing with the above chronic pain for longer than three months and has either failed to respond, was unable to tolerate, or simply did not get enough benefit from other more conservative therapies including, but not limited to: 1. Over-the-counter  medications 2. Anti-inflammatory medications 3. Muscle relaxants 4. Membrane stabilizers 5. Opioids 6. Physical therapy and/or chiropractic manipulation 7. Modalities (Heat, ice, etc.) 8. Invasive techniques such as nerve blocks. Mr. Kimple has attained more than 50% relief of the pain from a series of diagnostic injections conducted in separate occasions.  Pain Score: Pre-procedure:  /10 Post-procedure:  /10    Target: Cervical medial nerve  Location: Posterolateral aspect of the waist of the cervical articular pillar. Region: Cervical  Approach: Paramedial  Type of procedure: Radiofrequency Neurolytic Ablation   Position / Prep / Materials:  Position: Prone with head of the table was raised to facilitate breathing.  Prep solution: DuraPrep (Iodine Povacrylex [0.7% available iodine] and Isopropyl Alcohol, 74% w/w) Prep Area: Entire Posterior Cervical Region  Materials:  Tray:  RFA (Radiofrequency) tray Needle(s):  Type: RFA (Teflon-coated radiofrequency ablation needles)  Gauge (G):    ***     Length:    ***     Qty:    ***     Pre-op H&P Assessment:  Mr. Schupp is a 61 y.o. (year old), male patient, seen today for interventional treatment. He  has a past surgical history that includes Chest tube insertion. Mr. Sabino has a current medication list which includes the following prescription(s): amlodipine, aripiprazole, hydrocodone-acetaminophen, loratadine, mirtazapine, prazosin, and sildenafil. His primarily concern today is the No chief complaint on file.  Initial Vital Signs:  Pulse/HCG Rate:    Temp:   Resp:   BP:   SpO2:    BMI: Estimated body mass index is 24.39 kg/m as calculated from the following:   Height as of 10/09/21: 6\' 2"  (  1.88 m).   Weight as of 10/09/21: 190 lb (86.2 kg).  Risk Assessment: Allergies: Reviewed. He is allergic to tape.  Allergy Precautions: None required Coagulopathies: Reviewed. None identified.  Blood-thinner therapy: None at this  time Active Infection(s): Reviewed. None identified. Mr. Fillinger is afebrile  Site Confirmation: Mr. Harner was asked to confirm the procedure and laterality before marking the site Procedure checklist: Completed Consent: Before the procedure and under the influence of no sedative(s), amnesic(s), or anxiolytics, the patient was informed of the treatment options, risks and possible complications. To fulfill our ethical and legal obligations, as recommended by the American Medical Association's Code of Ethics, I have informed the patient of my clinical impression; the nature and purpose of the treatment or procedure; the risks, benefits, and possible complications of the intervention; the alternatives, including doing nothing; the risk(s) and benefit(s) of the alternative treatment(s) or procedure(s); and the risk(s) and benefit(s) of doing nothing. The patient was provided information about the general risks and possible complications associated with the procedure. These may include, but are not limited to: failure to achieve desired goals, infection, bleeding, organ or nerve damage, allergic reactions, paralysis, and death. In addition, the patient was informed of those risks and complications associated to Spine-related procedures, such as failure to decrease pain; infection (i.e.: Meningitis, epidural or intraspinal abscess); bleeding (i.e.: epidural hematoma, subarachnoid hemorrhage, or any other type of intraspinal or peri-dural bleeding); organ or nerve damage (i.e.: Any type of peripheral nerve, nerve root, or spinal cord injury) with subsequent damage to sensory, motor, and/or autonomic systems, resulting in permanent pain, numbness, and/or weakness of one or several areas of the body; allergic reactions; (i.e.: anaphylactic reaction); and/or death. Furthermore, the patient was informed of those risks and complications associated with the medications. These include, but are not limited to: allergic  reactions (i.e.: anaphylactic or anaphylactoid reaction(s)); adrenal axis suppression; blood sugar elevation that in diabetics may result in ketoacidosis or comma; water retention that in patients with history of congestive heart failure may result in shortness of breath, pulmonary edema, and decompensation with resultant heart failure; weight gain; swelling or edema; medication-induced neural toxicity; particulate matter embolism and blood vessel occlusion with resultant organ, and/or nervous system infarction; and/or aseptic necrosis of one or more joints. Finally, the patient was informed that Medicine is not an exact science; therefore, there is also the possibility of unforeseen or unpredictable risks and/or possible complications that may result in a catastrophic outcome. The patient indicated having understood very clearly. We have given the patient no guarantees and we have made no promises. Enough time was given to the patient to ask questions, all of which were answered to the patient's satisfaction. Mr. Skalicky has indicated that he wanted to continue with the procedure. Attestation: I, the ordering provider, attest that I have discussed with the patient the benefits, risks, side-effects, alternatives, likelihood of achieving goals, and potential problems during recovery for the procedure that I have provided informed consent. Date  Time: {CHL [facility]-PAIN TIME CHOICES:21018001}  Pre-Procedure Preparation:  Monitoring: As per clinic protocol. Respiration, ETCO2, SpO2, BP, heart rate and rhythm monitor placed and checked for adequate function Safety Precautions: Patient was assessed for positional comfort and pressure points before starting the procedure. Time-out: I initiated and conducted the "Time-out" before starting the procedure, as per protocol. The patient was asked to participate by confirming the accuracy of the "Time Out" information. Verification of the correct person, site, and procedure  were performed and confirmed by me,  the nursing staff, and the patient. "Time-out" conducted as per Joint Commission's Universal Protocol (UP.01.01.01). Time:    Description/Narrative of Procedure:          Rationale (medical necessity): procedure needed and proper for the diagnosis and/or treatment of the patient's medical symptoms and needs. Procedural Technique Safety Precautions: Aspiration looking for blood return was conducted prior to all injections. At no point did we inject any substances, as a needle was being advanced. No attempts were made at seeking any paresthesias. Safe injection practices and needle disposal techniques used. Medications properly checked for expiration dates. SDV (single dose vial) medications used. Description of the Procedure: Protocol guidelines were followed. The patient was assisted into a comfortable position. The target area was identified and the area prepped in the usual manner. Skin & deeper tissues infiltrated with local anesthetic. Appropriate amount of time allowed to pass for local anesthetics to take effect. The procedure needles were then advanced to the target area. Proper needle placement secured. Negative aspiration confirmed. Solution injected in intermittent fashion, asking for systemic symptoms every 0.5cc of injectate. The needles were then removed and the area cleansed, making sure to leave some of the prepping solution back to take advantage of its long term bactericidal properties.  Technical description of procedure:  Laterality:    ***    Radiofrequency Ablation (RFA) C3 Medial Branch Nerve RFA: The target area for the C3 dorsal medial articular branch is the lateral concave waist of the articular pillar of C3. Under fluoroscopic guidance, a Radiofrequency needle was inserted until contact was made with os over the postero-lateral aspect of the articular pillar of C3 (target area). Sensory and motor testing was conducted to properly adjust the  position of the needle. Once satisfactory placement of the needle was achieved, the numbing solution was slowly injected after negative aspiration for blood. 2.0 mL of the nerve block solution was injected without difficulty or complication. After waiting for at least 3 minutes, the ablation was performed. Once completed, the needle was removed intact. C4 Medial Branch Nerve RFA: The target area for the C4 dorsal medial articular branch is the lateral concave waist of the articular pillar of C4. Under fluoroscopic guidance, a Radiofrequency needle was inserted until contact was made with os over the postero-lateral aspect of the articular pillar of C4 (target area). Sensory and motor testing was conducted to properly adjust the position of the needle. Once satisfactory placement of the needle was achieved, the numbing solution was slowly injected after negative aspiration for blood. 2.0 mL of the nerve block solution was injected without difficulty or complication. After waiting for at least 3 minutes, the ablation was performed. Once completed, the needle was removed intact. C5 Medial Branch Nerve RFA: The target area for the C5 dorsal medial articular branch is the lateral concave waist of the articular pillar of C5. Under fluoroscopic guidance, a Radiofrequency needle was inserted until contact was made with os over the postero-lateral aspect of the articular pillar of C5 (target area). Sensory and motor testing was conducted to properly adjust the position of the needle. Once satisfactory placement of the needle was achieved, the numbing solution was slowly injected after negative aspiration for blood. 2.0 mL of the nerve block solution was injected without difficulty or complication. After waiting for at least 3 minutes, the ablation was performed. Once completed, the needle was removed intact. C6 Medial Branch Nerve RFA: The target area for the C6 dorsal medial articular branch is the lateral concave  waist  of the articular pillar of C6. Under fluoroscopic guidance, a Radiofrequency needle was inserted until contact was made with os over the postero-lateral aspect of the articular pillar of C6 (target area). Sensory and motor testing was conducted to properly adjust the position of the needle. Once satisfactory placement of the needle was achieved, the numbing solution was slowly injected after negative aspiration for blood. 2.0 mL of the nerve block solution was injected without difficulty or complication. After waiting for at least 3 minutes, the ablation was performed. Once completed, the needle was removed intact. C7 Medial Branch Nerve RFA: The target for the C7 dorsal medial articular branch lies on the superior-medial tip of the C7 transverse process. Under fluoroscopic guidance, a Radiofrequency needle was inserted until contact was made with os over the postero-lateral aspect of the articular pillar of C7 (target area). Sensory and motor testing was conducted to properly adjust the position of the needle. Once satisfactory placement of the needle was achieved, the numbing solution was slowly injected after negative aspiration for blood. 2.0 mL of the nerve block solution was injected without difficulty or complication. After waiting for at least 3 minutes, the ablation was performed. Once completed, the needle was removed intact. Radiofrequency lesioning (ablation):  Radiofrequency Generator: Medtronic AccurianTM AG 1000 RF Generator Sensory Stimulation Parameters: 50 Hz was used to locate & identify the nerve, making sure that the needle was positioned such that there was no sensory stimulation below 0.3 V or above 0.7 V. Motor Stimulation Parameters: 2 Hz was used to evaluate the motor component. Care was taken not to lesion any nerves that demonstrated motor stimulation of the lower extremities at an output of less than 2.5 times that of the sensory threshold, or a maximum of 2.0 V. Lesioning  Technique Parameters: Standard Radiofrequency settings. (Not bipolar or pulsed.) Temperature Settings: 80 degrees C Lesioning time: 60 seconds Intra-operative Compliance: Compliant   Once the entire procedure was completed, the treated area was cleaned, making sure to leave some of the prepping solution back to take advantage of its long term bactericidal properties. Intra-operative Compliance: Compliant  Anatomy Reference Guide:           There were no vitals filed for this visit.   Start Time:   hrs. End Time:   hrs.  Imaging Guidance (Spinal):          Type of Imaging Technique: Fluoroscopy Guidance (Spinal) Indication(s): Assistance in needle guidance and placement for procedures requiring needle placement in or near specific anatomical locations not easily accessible without such assistance. Exposure Time: Please see nurses notes. Contrast: None used. Fluoroscopic Guidance: I was personally present during the use of fluoroscopy. "Tunnel Vision Technique" used to obtain the best possible view of the target area. Parallax error corrected before commencing the procedure. "Direction-depth-direction" technique used to introduce the needle under continuous pulsed fluoroscopy. Once target was reached, antero-posterior, oblique, and lateral fluoroscopic projection used confirm needle placement in all planes. Images permanently stored in EMR. Interpretation: No contrast injected. I personally interpreted the imaging intraoperatively. Adequate needle placement confirmed in multiple planes. Permanent images saved into the patient's record.  Post-operative Assessment:  Post-procedure Vital Signs:  Pulse/HCG Rate:    Temp:   Resp:   BP:   SpO2:    EBL: None  Complications: No immediate post-treatment complications observed by team, or reported by patient.  Note: The patient tolerated the entire procedure well. A repeat set of vitals were taken after the procedure and  the patient was  kept under observation following institutional policy, for this type of procedure. Post-procedural neurological assessment was performed, showing return to baseline, prior to discharge. The patient was provided with post-procedure discharge instructions, including a section on how to identify potential problems. Should any problems arise concerning this procedure, the patient was given instructions to immediately contact us, at any time, without hesitation. In any case, we plan to contact the patient by telephone for a follow-up status report regarding this interventional procedure.  Comments:  No additional relevant information.  Plan of Care  Orders:  No orders of the defined types were placed in this encounter.  Chronic Opioid Analgesic:  None MME/day: 0 mg/day   Medications ordered for procedure: No orders of the defined types were placed in this encounter.  Medications administered: Sallee Lange. Moll had no medications administered during this visit.  See the medical record for exact dosing, route, and time of administration.  Follow-up plan:   No follow-ups on file.       Interventional Therapies  Risk  Complexity Considerations:   Estimated body mass index is 23.13 kg/m as calculated from the following:   Height as of this encounter: 6\' 4"  (1.93 m).   Weight as of this encounter: 190 lb (86.2 kg). WNL        Planned  Pending:   Diagnostic left cervical facet RFA #1    Under consideration:   Diagnostic bilateral cervical facet RFA #1 (starting with the left side)    Completed:   Diagnostic right cervical facet MBB x2 (09/11/2021) (100/100/100x1wk/50)    Therapeutic  Palliative (PRN) options:   None established      Recent Visits Date Type Provider Dept  10/09/21 Procedure visit Milinda Pointer, MD [facility]  09/25/21 Office Visit Milinda Pointer, MD [facility]  09/11/21 Procedure visit Milinda Pointer, MD [facility]   08/14/21 Office Visit Milinda Pointer, MD [facility]  07/26/21 Procedure visit Milinda Pointer, MD [facility]  Showing recent visits within past 90 days and meeting all other requirements Future Appointments Date Type Provider Dept  10/23/21 Appointment Milinda Pointer, MD [facility]  Showing future appointments within next 90 days and meeting all other requirements  Disposition: Discharge home  Discharge (Date  Time): 10/23/2021;   hrs.   Primary Care Physician: Joseph Art, MD Location: [facility] Note by: Gaspar Cola, MD Date: 10/23/2021; Time: 4:52 PM  Disclaimer:  Medicine is not an Chief Strategy Officer. The only guarantee in medicine is that nothing is guaranteed. It is important to note that the decision to proceed with this intervention was based on the information collected from the patient. The Data and conclusions were drawn from the patient's questionnaire, the interview, and the physical examination. Because the information was provided in large part by the patient, it cannot be guaranteed that it has not been purposely or unconsciously manipulated. Every effort has been made to obtain as much relevant data as possible for this evaluation. It is important to note that the conclusions that lead to this procedure are derived in large part from the available data. Always take into account that the treatment will also be dependent on availability of resources and existing treatment guidelines, considered by other Pain Management Practitioners as being common knowledge and practice, at the time of the intervention. For Medico-Legal purposes, it is also important to point out that variation in procedural techniques and pharmacological choices are  the acceptable norm. The indications, contraindications, technique, and results of the above procedure should only be interpreted and judged by a Board-Certified  Interventional Pain Specialist with extensive familiarity and expertise in the same exact procedure and technique.

## 2021-10-23 ENCOUNTER — Ambulatory Visit: Payer: No Typology Code available for payment source | Attending: Pain Medicine | Admitting: Pain Medicine

## 2021-10-23 ENCOUNTER — Ambulatory Visit
Admission: RE | Admit: 2021-10-23 | Discharge: 2021-10-23 | Disposition: A | Discharge status: Home / Self Care | Payer: No Typology Code available for payment source | Source: Ambulatory Visit | Attending: Pain Medicine | Admitting: Pain Medicine

## 2021-10-23 ENCOUNTER — Encounter: Payer: Self-pay | Admitting: Pain Medicine

## 2021-10-23 VITALS — BP 133/85 | HR 51 | Temp 97.3°F | Resp 9 | Ht 73.0 in | Wt 210.0 lb

## 2021-10-23 DIAGNOSIS — R001 Bradycardia, unspecified: Secondary | ICD-10-CM | POA: Insufficient documentation

## 2021-10-23 DIAGNOSIS — R42 Dizziness and giddiness: Secondary | ICD-10-CM

## 2021-10-23 DIAGNOSIS — G8918 Other acute postprocedural pain: Secondary | ICD-10-CM | POA: Diagnosis present

## 2021-10-23 DIAGNOSIS — M47812 Spondylosis without myelopathy or radiculopathy, cervical region: Secondary | ICD-10-CM

## 2021-10-23 DIAGNOSIS — M5030 Other cervical disc degeneration, unspecified cervical region: Secondary | ICD-10-CM | POA: Diagnosis present

## 2021-10-23 DIAGNOSIS — M479 Spondylosis, unspecified: Secondary | ICD-10-CM

## 2021-10-23 DIAGNOSIS — K219 Gastro-esophageal reflux disease without esophagitis: Secondary | ICD-10-CM | POA: Diagnosis present

## 2021-10-23 DIAGNOSIS — Z5189 Encounter for other specified aftercare: Secondary | ICD-10-CM | POA: Diagnosis present

## 2021-10-23 DIAGNOSIS — R079 Chest pain, unspecified: Secondary | ICD-10-CM | POA: Diagnosis present

## 2021-10-23 DIAGNOSIS — G4486 Cervicogenic headache: Secondary | ICD-10-CM

## 2021-10-23 DIAGNOSIS — M542 Cervicalgia: Secondary | ICD-10-CM | POA: Diagnosis present

## 2021-10-23 MED ORDER — LACTATED RINGERS IV SOLN: Freq: Once | INTRAVENOUS | Status: AC

## 2021-10-23 MED ORDER — PENTAFLUOROPROP-TETRAFLUOROETH EX AERO
INHALATION_SPRAY | Freq: Once | CUTANEOUS | Status: AC
  Administered 2021-10-23: 30 via TOPICAL

## 2021-10-23 MED ORDER — ESOMEPRAZOLE MAGNESIUM 40 MG PO CPDR: 40.0000 mg | DELAYED_RELEASE_CAPSULE | Freq: Every day | ORAL | 0 refills | Status: AC

## 2021-10-23 MED ORDER — MIDAZOLAM HCL 5 MG/5ML IJ SOLN
0.5000 mg | Freq: Once | INTRAMUSCULAR | Status: AC
  Administered 2021-10-23: 1.5 mg via INTRAVENOUS

## 2021-10-23 MED ORDER — MIDAZOLAM HCL 5 MG/5ML IJ SOLN
INTRAMUSCULAR | Status: AC
  Filled 2021-10-23: qty 5

## 2021-10-23 MED ORDER — FENTANYL CITRATE (PF) 100 MCG/2ML IJ SOLN
25.0000 ug | INTRAMUSCULAR | Status: DC | PRN
  Administered 2021-10-23: 50 ug via INTRAVENOUS

## 2021-10-23 MED ORDER — GLYCOPYRROLATE 0.2 MG/ML IJ SOLN
0.2000 mg | Freq: Once | INTRAMUSCULAR | Status: AC
  Administered 2021-10-23: 0.2 mg via INTRAVENOUS

## 2021-10-23 MED ORDER — DEXAMETHASONE SODIUM PHOSPHATE 10 MG/ML IJ SOLN
INTRAMUSCULAR | Status: AC
  Filled 2021-10-23: qty 1

## 2021-10-23 MED ORDER — HYDROCODONE-ACETAMINOPHEN 5-325 MG PO TABS: 1.0000 | ORAL_TABLET | Freq: Three times a day (TID) | ORAL | 0 refills | Status: AC | PRN

## 2021-10-23 MED ORDER — TRIAMCINOLONE ACETONIDE 40 MG/ML IJ SUSP
INTRAMUSCULAR | Status: AC
  Filled 2021-10-23: qty 1

## 2021-10-23 MED ORDER — LIDOCAINE HCL 2 % IJ SOLN
20.0000 mL | Freq: Once | INTRAMUSCULAR | Status: AC
  Administered 2021-10-23: 400 mg

## 2021-10-23 MED ORDER — DEXAMETHASONE SODIUM PHOSPHATE 10 MG/ML IJ SOLN
10.0000 mg | Freq: Once | INTRAMUSCULAR | Status: AC
  Administered 2021-10-23: 10 mg

## 2021-10-23 MED ORDER — ROPIVACAINE HCL 2 MG/ML IJ SOLN
INTRAMUSCULAR | Status: AC
  Filled 2021-10-23: qty 20

## 2021-10-23 MED ORDER — LIDOCAINE HCL 2 % IJ SOLN
INTRAMUSCULAR | Status: AC
  Filled 2021-10-23: qty 20

## 2021-10-23 MED ORDER — FENTANYL CITRATE (PF) 100 MCG/2ML IJ SOLN
INTRAMUSCULAR | Status: AC
  Filled 2021-10-23: qty 2

## 2021-10-23 MED ORDER — ROPIVACAINE HCL 2 MG/ML IJ SOLN
9.0000 mL | Freq: Once | INTRAMUSCULAR | Status: AC
  Administered 2021-10-23: 9 mL via PERINEURAL

## 2021-10-23 NOTE — Progress Notes (Signed)
0845   GLYCOPYROLATE 0.2MG  IVP PER MD VERBAL ORDER.

## 2021-10-23 NOTE — Progress Notes (Signed)
Safety precautions to be maintained throughout the outpatient stay will include: orient to surroundings, keep bed in low position, maintain call bell within reach at all times, provide assistance with transfer out of bed and ambulation.  

## 2021-10-23 NOTE — Patient Instructions (Signed)
___________________________________________________________________________________________  Post-Radiofrequency (RF) Discharge Instructions  You have just completed a Radiofrequency Neurotomy.  The following instructions will provide you with information and guidelines for self-care upon discharge.  If at any time you have questions or concerns please call your physician. DO NOT DRIVE YOURSELF!!  Instructions: Apply ice: Fill a plastic sandwich bag with crushed ice. Cover it with a small towel and apply to injection site. Apply for 15 minutes then remove x 15 minutes. Repeat sequence on day of procedure, until you go to bed. The purpose is to minimize swelling and discomfort after procedure. Apply heat: Apply heat to procedure site starting the day following the procedure. The purpose is to treat any soreness and discomfort from the procedure. Food intake: No eating limitations, unless stipulated above.  Nevertheless, if you have had sedation, you may experience some nausea.  In this case, it may be wise to wait at least two hours prior to resuming regular diet. Physical activities: Keep activities to a minimum for the first 8 hours after the procedure. For the first 24 hours after the procedure, do not drive a motor vehicle,  Operate heavy machinery, power tools, or handle any weapons.  Consider walking with the use of an assistive device or accompanied by an adult for the first 24 hours.  Do not drink alcoholic beverages including beer.  Do not make any important decisions or sign any legal documents. Go home and rest today.  Resume activities tomorrow, as tolerated.  Use caution in moving about as you may experience mild leg weakness.  Use caution in cooking, use of household electrical appliances and climbing steps. Driving: If you have received any sedation, you are not allowed to drive for 24 hours after your procedure. Blood thinner: Restart your blood thinner 6 hours after your procedure. (Only  for those taking blood thinners) Insulin: As soon as you can eat, you may resume your normal dosing schedule. (Only for those taking insulin) Medications: May resume pre-procedure medications.  Do not take any drugs, other than what has been prescribed to you. Infection prevention: Keep procedure site clean and dry. Post-procedure Pain Diary: Extremely important that this be done correctly and accurately. Recorded information will be used to determine the next step in treatment. Pain evaluated is that of treated area only. Do not include pain from an untreated area. Complete every hour, on the hour, for the initial 8 hours. Set an alarm to help you do this part accurately. Do not go to sleep and have it completed later. It will not be accurate. Follow-up appointment: Keep your follow-up appointment after the procedure. Usually 2-6 weeks after radiofrequency. Bring you pain diary. The information collected will be essential for your long-term care.   Expect: From numbing medicine (AKA: Local Anesthetics): Numbness or decrease in pain. Onset: Full effect within 15 minutes of injected. Duration: It will depend on the type of local anesthetic used. On the average, 1 to 8 hours.  From steroids (when added): Decrease in swelling or inflammation. Once inflammation is improved, relief of the pain will follow. Onset of benefits: Depends on the amount of swelling present. The more swelling, the longer it will take for the benefits to be seen. In some cases, up to 10 days. Duration: Steroids will stay in the system x 2 weeks. Duration of benefits will depend on multiple posibilities including persistent irritating factors. From procedure: Some discomfort is to be expected once the numbing medicine wears off. In the case of radiofrequency procedures,   this may last as long as 6 weeks. Additional post-procedure pain medication is provided for this. Discomfort is minimized if ice and heat are applied as  instructed.  Call if: You experience numbness and weakness that gets worse with time, as opposed to wearing off. He experience any unusual bleeding, difficulty breathing, or loss of the ability to control your bowel and bladder. (This applies to Spinal procedures only) You experience any redness, swelling, heat, red streaks, elevated temperature, fever, or any other signs of a possible infection.  Emergency Numbers: Durning business hours (Monday - Thursday, 8:00 AM - 4:00 PM) (Friday, 9:00 AM - 12:00 Noon): [phone] After hours: [phone] ____________________________________________________________________________________________  ____________________________________________________________________________________________  Virtual Visits   What is a "Virtual Visit"? It is a healthcare communication encounter (medical visit) that takes place on real time (NOT TEXT or E-MAIL) over the telephone or computer device (desktop, laptop, tablet, smart phone, etc.). It allows for more location flexibility between the patient and the healthcare provider.  Who decides when these types of visits will be used? The physician.  Who is eligible for these types of visits? Only those patients that can be reliably reached over the telephone.  What do you mean by reliably? We do not have time to call everyone multiple times, therefore those that tend to screen calls and then call back later are not suitable candidates for this system. We understand how people are reluctant to pickup on "unknown" calls, therefore, we suggest adding our telephone numbers to your list of "CONTACT(s)". This way, you should be able to readily identify our calls when you receive one. All of our numbers are available below.   Who is not eligible? This option is not available for medication management encounters, specially for controlled substances. Patients on pain medications that fall under the category of controlled  substances have to come in for "Face-to-Face" encounters. This is required for mandatory monitoring of these substances. You may be asked to provide a sample for an unannounced urine drug screening test (UDS), and we will need to count your pain pills. Not bringing your pills to be counted may result in no refill. Obviously, neither one of these can be done over the phone.  When will this type of visits be used? You can request a virtual visit whenever you are physically unable to attend a regular appointment. The decision will be made by the physician (or healthcare provider) on a case by case basis.   At what time will I be called? This is an excellent question. The providers will try to call you whenever they have time available. Do not expect to be called at any specific time. The secretaries will assign you a time for your virtual visit appointment, but this is done simply to keep a list of those patients that need to be called, but not for the purpose of keeping a time schedule. Be advised that the call may come in anytime during the day, between the hours of 8:00 AM and 8::00 PM, depending on provider availability. We do understand that the system is not perfect. If you are unable to be available that day on a moments notice, then request an "in-person" appointment rather than a "virtual visit".  Can I request my medication visits to be "Virtual"? Yes you may request it, but the decision is entirely up to the healthcare provider. Control substances require specific monitoring that requires Face-to-Face encounters. The number of encounters  and the extent of the monitoring   is determined on a case by case basis.  Add a new contact to your smart phone and label it "PAIN CLINIC" Under this contact add the following numbers: Main: [phone] (Official Contact Number) Nurses: [phone] (These are outgoing only calling systems. Do not call this number.) Dr. Ianna Salmela: [phone] or [phone] (Outgoing calls only. Do not call this number.)  ____________________________________________________________________________________________   

## 2021-10-24 ENCOUNTER — Telehealth: Payer: Self-pay | Admitting: *Deleted

## 2021-10-24 NOTE — Telephone Encounter (Signed)
Attempted to call for post procedure follow-up. Message left. 

## 2021-12-02 NOTE — Progress Notes (Unsigned)
PROVIDER NOTE: Information contained herein reflects review and annotations entered in association with encounter. Interpretation of such information and data should be left to medically-trained personnel. Information provided to patient can be located elsewhere in the medical record under "Patient Instructions". Document created using STT-dictation technology, any transcriptional errors that may result from process are unintentional.    Patient: Darrell Bailey  Service Category: E/M  Provider: Gaspar Cola, MD  DOB: [birth year]  DOS: 12/04/2021  Referring Provider: Joseph Art, MD  MRN: [id number]  Specialty: Interventional Pain Management  PCP: Joseph Art, MD  Type: Established Patient  Setting: Ambulatory outpatient    Location: Office  Delivery: Face-to-face     HPI  [name], a 61 y.o. year old male, is here today because of his No primary diagnosis found.. [name] primary complain today is No chief complaint on file. Last encounter: My last encounter with him was on 10/23/2021. Pertinent problems: 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 Pain Assessment: Severity of   is  reported as a  /10. Location:    / . Onset:  . Quality:  . Timing:  . Modifying factor(s):  Marland Kitchen Vitals:  vitals were not taken for this visit.   Reason for encounter: post-procedure evaluation and assessment. ***  Post-procedure evaluation   Type: Cervical Facet, Medial Branch Radiofrequency Ablation (RFA)  #1  Laterality: Right (-RT)  Level: C3, C4, C5, C6, & C7 Medial Branch Level(s). Lesioning of these levels should completely denervate the C3-4, C4-5, C5-6, and the C6-7 cervical facet joints.  Imaging: Fluoroscopy-guided         Anesthesia: Local anesthesia (1-2% Lidocaine) Anxiolysis: IV Versed 2.0 mg Sedation: Moderate Sedation Fentanyl 1 mL (50 mcg) DOS: 10/23/2021  Performed by: Gaspar Cola, MD  Purpose: Therapeutic/Palliative Indications: Chronic neck pain (cervicalgia) severe enough to impact quality of life or function. Rationale (medical necessity): procedure needed and proper for the diagnosis and/or treatment of [name] medical symptoms and needs. Indications: 1. Cervical facet syndrome (Bilateral) (L>R)   2. Cervicalgia (Bilateral) (L>R)   3. Cervicogenic headache (Bilateral) (L>R)   4. DDD (degenerative disc disease), cervical   5. Spondylosis without myelopathy or radiculopathy, cervical region   6. Vertigo of cervical arthrosis syndrome    [name] has been dealing with the above chronic pain for longer than three months and has either failed to respond, was unable to tolerate, or simply did not get enough benefit from other more conservative therapies including, but not limited to: 1. Over-the-counter medications 2. Anti-inflammatory medications 3. Muscle relaxants 4. Membrane stabilizers 5. Opioids 6. Physical therapy and/or chiropractic manipulation 7. Modalities (Heat, ice, etc.) 8. Invasive techniques such as nerve blocks. [name] has attained more than 50% relief of the pain from a series of diagnostic injections conducted in separate  occasions.  Pain Score: Pre-procedure: 7 /10 Post-procedure:  0-No pain/10     Effectiveness:  Initial hour after procedure:   ***. Subsequent 4-6 hours post-procedure:   ***. Analgesia past initial 6 hours:   ***. Ongoing improvement:  Analgesic:  *** Function:    ***    ROM:    ***     Pharmacotherapy Assessment  Analgesic: None MME/day: 0 mg/day   Monitoring: [place] PMP: PDMP reviewed during this encounter.       Pharmacotherapy: No side-effects or adverse reactions reported. Compliance: No problems identified. Effectiveness: Clinically acceptable.  No notes on file  No results found for: "CBDTHCR" No results found for: "D8THCCBX" No results found for: "D9THCCBX"  UDS:  No results found for: "SUMMARY"    ROS  Constitutional: Denies any fever or chills Gastrointestinal: No reported hemesis, hematochezia, vomiting, or acute GI distress Musculoskeletal: Denies any acute onset joint swelling, redness, loss of ROM, or weakness Neurological: No reported episodes of acute onset apraxia, aphasia, dysarthria, agnosia, amnesia, paralysis, loss of coordination, or loss of consciousness  Medication Review  ARIPiprazole, amLODipine, esomeprazole, loratadine, losartan, mirtazapine, prazosin, and sildenafil  History Review  Allergy: [name] is allergic to tape. Drug: [name]  reports current drug use. Drug: Marijuana. Alcohol:  reports current alcohol use. Tobacco:  reports that he has quit smoking. His smoking use included cigarettes. He has never used smokeless tobacco. Social: [name]  reports that he has quit smoking. His smoking use included cigarettes. He has never used smokeless tobacco. He reports current alcohol use. He reports current drug use. Drug: Marijuana. Medical:  has a past medical history of ADHD (attention deficit hyperactivity disorder), GERD (gastroesophageal reflux disease), Hypertension, Pneumonia, and Pulmonary embolism ([place]). Surgical: Mr.  Darrell Bailey  has a past surgical history that includes Chest tube insertion. Family: family history is not on file.  Laboratory Chemistry Profile   Renal Lab Results  Component Value Date   BUN 16 06/10/2019   CREATININE 1.62 (H) 06/10/2019   GFRAA 53 (L) 06/10/2019   GFRNONAA 46 (L) 06/10/2019    Hepatic Lab Results  Component Value Date   AST 39 (H) 03/12/2008   ALT 14 03/12/2008   ALBUMIN 5.0 03/12/2008   ALKPHOS 85 03/12/2008   LIPASE 48 03/12/2008    Electrolytes Lab Results  Component Value Date   NA 140 06/10/2019   K 4.2 06/10/2019   CL 107 06/10/2019   CALCIUM 8.9 06/10/2019   MG 1.9 04/25/2008    Bone No results found for: "VD25OH", "VD125OH2TOT", "TM1962IW9", "NL8921JH4", "25OHVITD1", "25OHVITD2", "25OHVITD3", "TESTOFREE", "TESTOSTERONE"  Inflammation (CRP: Acute Phase) (ESR: Chronic Phase) No results found for: "CRP", "ESRSEDRATE", "LATICACIDVEN"       Note: Above Lab results reviewed.  Recent Imaging Review  DG PAIN CLINIC C-ARM 1-60 MIN NO REPORT Fluoro was used, but no Radiologist interpretation will be provided.  Please refer to "NOTES" tab for provider progress note. Note: Reviewed        Physical Exam  General appearance: Well nourished, well developed, and well hydrated. In no apparent acute distress Mental status: Alert, oriented x 3 (person, place, & time)       Respiratory: No evidence of acute respiratory distress Eyes: PERLA Vitals: There were no vitals taken for this visit. BMI: Estimated body mass index is 27.71 kg/m as calculated from the following:   Height as of 10/23/21: _0  (1.854 m).   Weight as of 10/23/21: 210 lb (95.3 kg). Ideal: Patient weight not recorded  Assessment   Diagnosis Status  No  diagnosis found. Controlled Controlled Controlled   Updated Problems: No problems updated.  Plan of Care  Problem-specific:  No problem-specific Assessment & Plan notes found for this encounter.  [name] has a  current medication list which includes the following long-term medication(s): amlodipine, aripiprazole, esomeprazole, loratadine, losartan, mirtazapine, prazosin, and sildenafil.  Pharmacotherapy (Medications Ordered): No orders of the defined types were placed in this encounter.  Orders:  No orders of the defined types were placed in this encounter.  Follow-up plan:   No follow-ups on file.     Interventional Therapies  Risk  Complexity Considerations:   Estimated body mass index is 23.13 kg/m as calculated from the following:   Height as of this encounter: _0  (1.93 m).   Weight as of this encounter: 190 lb (86.2 kg). WNL        Planned  Pending:   Diagnostic left cervical facet RFA #1    Under consideration:   Diagnostic bilateral cervical facet RFA #1 (starting with the left side)    Completed:   Diagnostic right cervical facet MBB x2 (09/11/2021) (100/100/100x1wk/50)    Therapeutic  Palliative (PRN) options:   None established       Recent Visits Date Type Provider Dept  10/23/21 Procedure visit Milinda Pointer, MD [facility]  10/09/21 Procedure visit Milinda Pointer, MD [facility]  09/25/21 Office Visit Milinda Pointer, MD [facility]  09/11/21 Procedure visit Milinda Pointer, MD [facility]  Showing recent visits within past 90 days and meeting all other requirements Future Appointments Date Type Provider Dept  12/04/21 Appointment Milinda Pointer, MD [facility]  Showing future appointments within next 90 days and meeting all other requirements  I discussed the assessment and treatment plan with the patient. The patient was provided an opportunity to ask questions and all were answered. The patient agreed with the plan and demonstrated an understanding of the instructions.  Patient advised to call back or seek an in-person evaluation if the symptoms or condition worsens.  Duration of  encounter: *** minutes.  Total time on encounter, as per AMA guidelines included both the face-to-face and non-face-to-face time personally spent by the physician and/or other qualified health care professional(s) on the day of the encounter (includes time in activities that require the physician or other qualified health care professional and does not include time in activities normally performed by clinical staff). Physician's time may include the following activities when performed: preparing to see the patient (eg, review of tests, pre-charting review of records) obtaining and/or reviewing separately obtained history performing a medically appropriate examination and/or evaluation counseling and educating the patient/family/caregiver ordering medications, tests, or procedures referring and communicating with other health care professionals (when not separately reported) documenting clinical information in the electronic or other health record independently interpreting results (not separately reported) and communicating results to the patient/ family/caregiver care coordination (not separately reported)  Note by: Gaspar Cola, MD Date: 12/04/2021; Time: 2:37 PM

## 2021-12-04 ENCOUNTER — Encounter: Payer: Self-pay | Admitting: Pain Medicine

## 2021-12-04 ENCOUNTER — Ambulatory Visit: Payer: No Typology Code available for payment source | Attending: Pain Medicine | Admitting: Pain Medicine

## 2021-12-04 VITALS — BP 138/89 | HR 58 | Temp 97.1°F | Ht 74.0 in | Wt 210.0 lb

## 2021-12-04 DIAGNOSIS — M542 Cervicalgia: Secondary | ICD-10-CM | POA: Diagnosis not present

## 2021-12-04 DIAGNOSIS — M479 Spondylosis, unspecified: Secondary | ICD-10-CM | POA: Diagnosis present

## 2021-12-04 DIAGNOSIS — M25511 Pain in right shoulder: Secondary | ICD-10-CM | POA: Insufficient documentation

## 2021-12-04 DIAGNOSIS — G8929 Other chronic pain: Secondary | ICD-10-CM | POA: Diagnosis present

## 2021-12-04 DIAGNOSIS — M47812 Spondylosis without myelopathy or radiculopathy, cervical region: Secondary | ICD-10-CM | POA: Diagnosis not present

## 2021-12-04 DIAGNOSIS — M62838 Other muscle spasm: Secondary | ICD-10-CM

## 2021-12-04 DIAGNOSIS — R42 Dizziness and giddiness: Secondary | ICD-10-CM | POA: Insufficient documentation

## 2021-12-04 NOTE — Progress Notes (Signed)
Safety precautions to be maintained throughout the outpatient stay will include: orient to surroundings, keep bed in low position, maintain call bell within reach at all times, provide assistance with transfer out of bed and ambulation.  

## 2021-12-04 NOTE — Patient Instructions (Signed)
____________________________________________________________________________________________  Pain Prevention Technique  Definition:   A technique used to minimize the effects of an activity known to cause inflammation or swelling, which in turn leads to an increase in pain.  Purpose: To prevent swelling from occurring. It is based on the fact that it is easier to prevent swelling from happening than it is to get rid of it, once it occurs.  Contraindications: Anyone with allergy or hypersensitivity to the recommended medications. Anyone taking anticoagulants (Blood Thinners) (e.g., Coumadin, Warfarin, Plavix, etc.). Patients in Renal Failure.  Technique: Before you undertake an activity known to cause pain, or a flare-up of your chronic pain, and before you experience any pain, do the following:  On a full stomach, take 4 (four) over the counter Ibuprofens 200mg tablets (Motrin), for a total of 800 mg. In addition, take over the counter Magnesium 400 to 500 mg, before doing the activity.  Six (6) hours later, again on a full stomach, repeat the Ibuprofen. That night, take a warm shower and stretch under the running warm water.  This technique may be sufficient to abort the pain and discomfort before it happens. Keep in mind that it takes a lot less medication to prevent swelling than it takes to eliminate it once it occurs.  ____________________________________________________________________________________________   

## 2021-12-12 IMAGING — CT CT HEAD W/O CM
4 series · 15 of 47 positions shown, 17 images · non-contrast
Comparison: None.

CLINICAL DATA: Syncope with normal neuro exam.

EXAM:
CT HEAD WITHOUT CONTRAST
TECHNIQUE: Contiguous axial images were obtained from the base of the skull
through the vertex without intravenous contrast.

[Series 3: head without · axial · non-contrast · 0.46mm/px · z∈[-187,-57]mm · 7 of 36 slices shown, 9 images]
[im 5/36  brain]
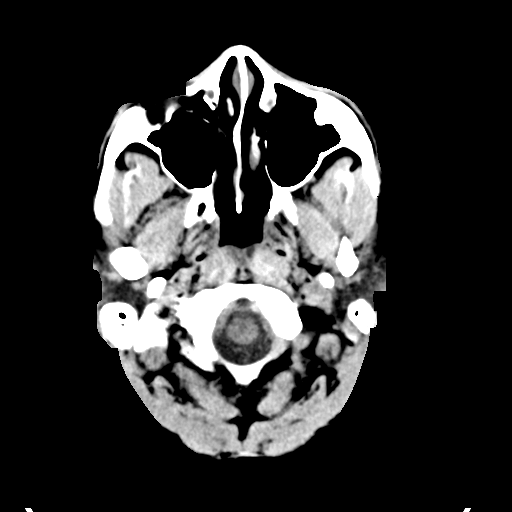
[im 5/36  bone]
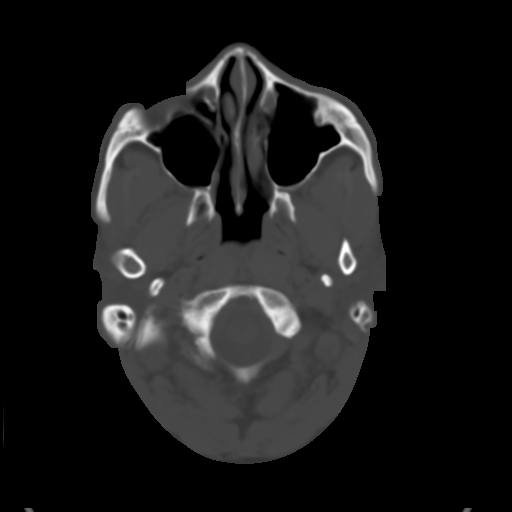
[im 9/36  brain]
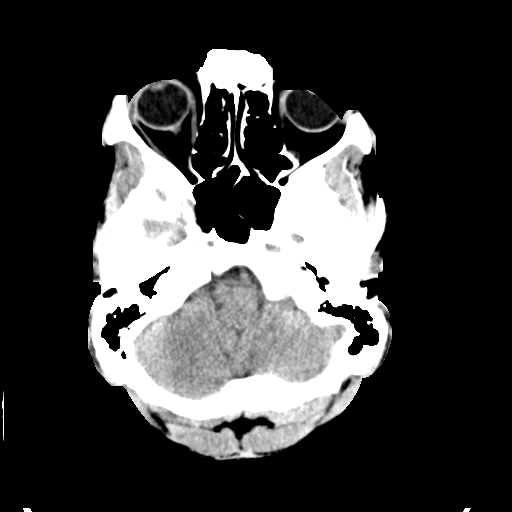
[im 14/36  brain]
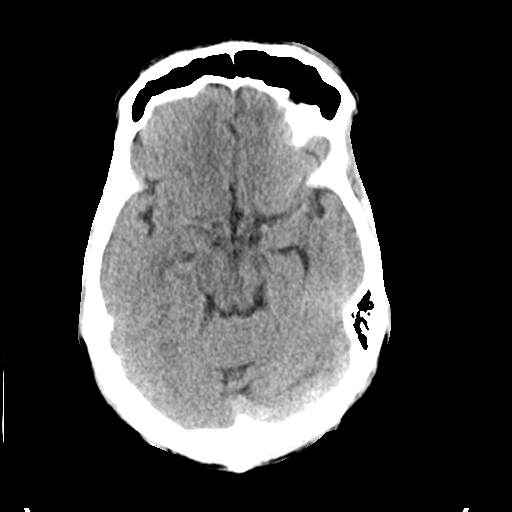
[im 18/36  brain]
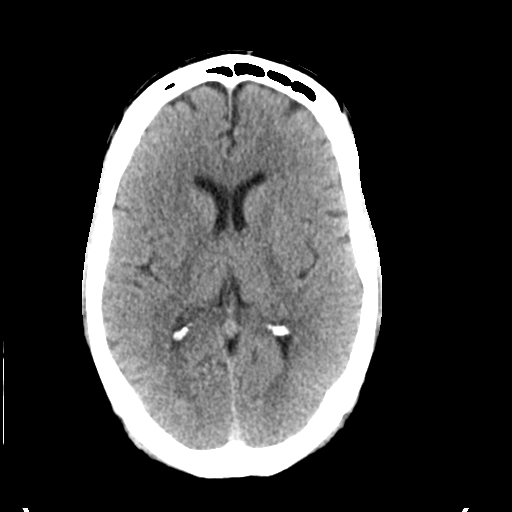
[im 22/36  brain]
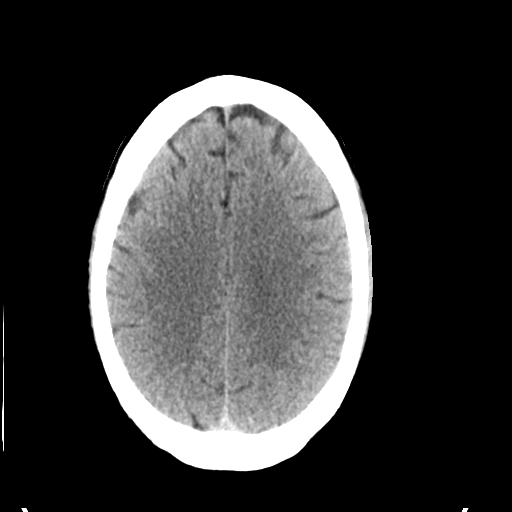
[im 22/36  bone]
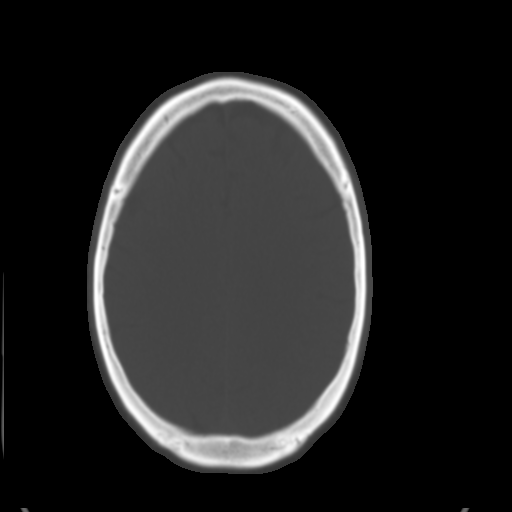
[im 27/36  brain]
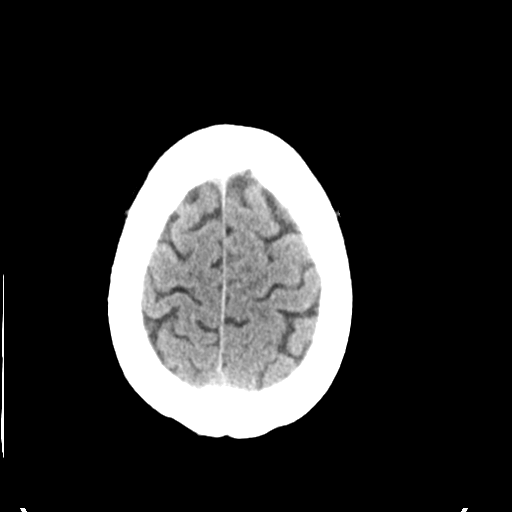
[im 31/36  brain]
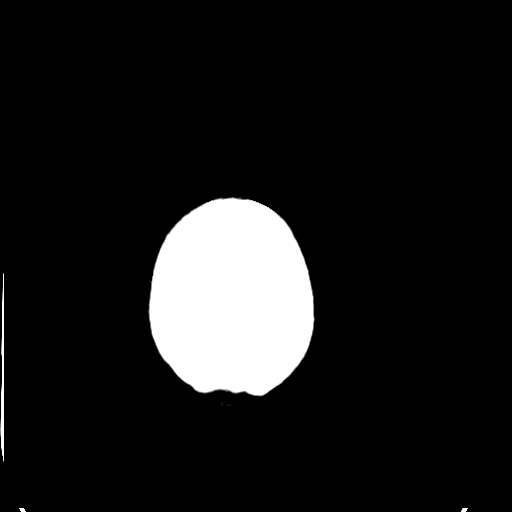

[Series 4: head bone · axial · 0.46mm/px · z∈[-191,-173]mm · 2 of 90 slices shown]
[im 9/90  bone]
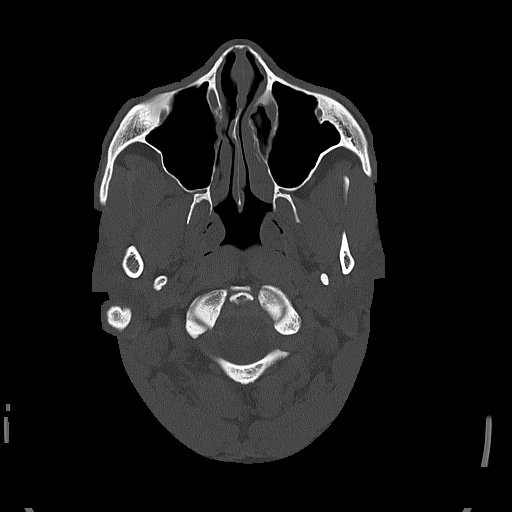
[im 18/90  bone]
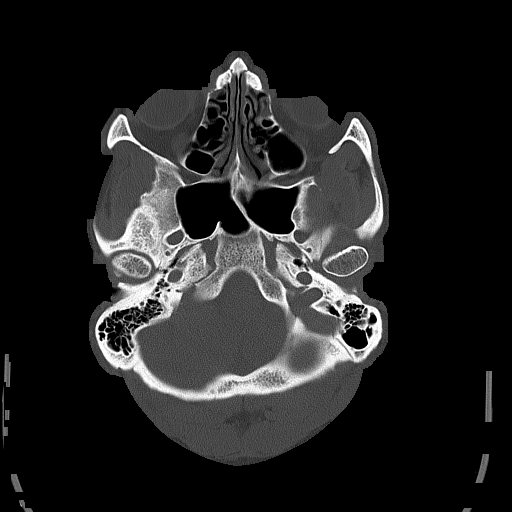

[Series 5: head without cor · coronal · non-contrast · 0.35mm/px · 3 of 77 slices shown]
[im 26/77  brain]
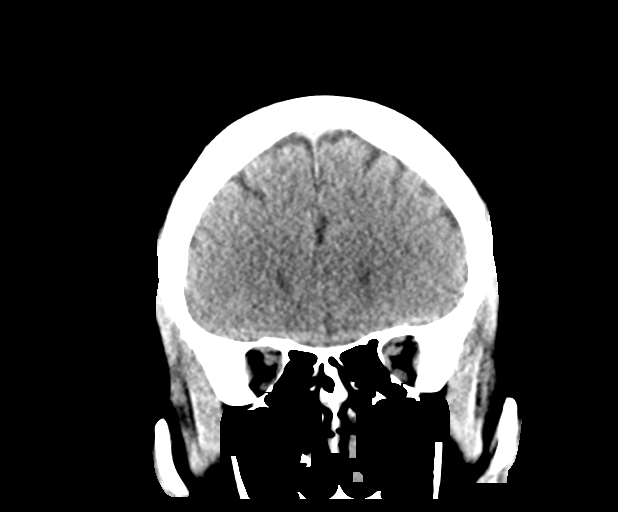
[im 34/77  brain]
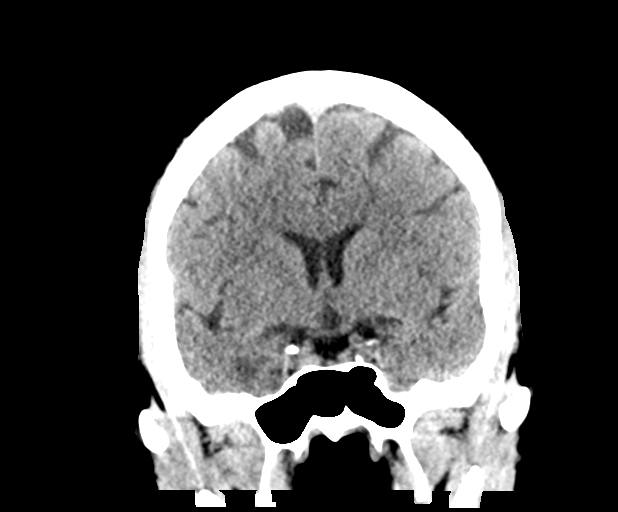
[im 43/77  brain]
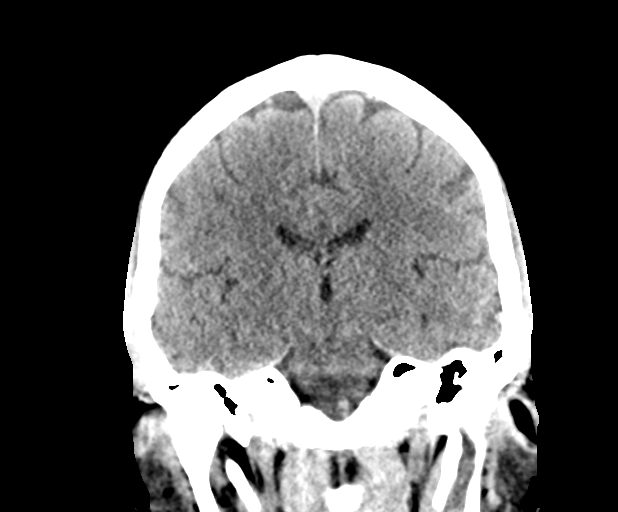

[Series 6: head without sag · sagittal · non-contrast · 0.35mm/px · 3 of 63 slices shown]
[im 21/63  brain]
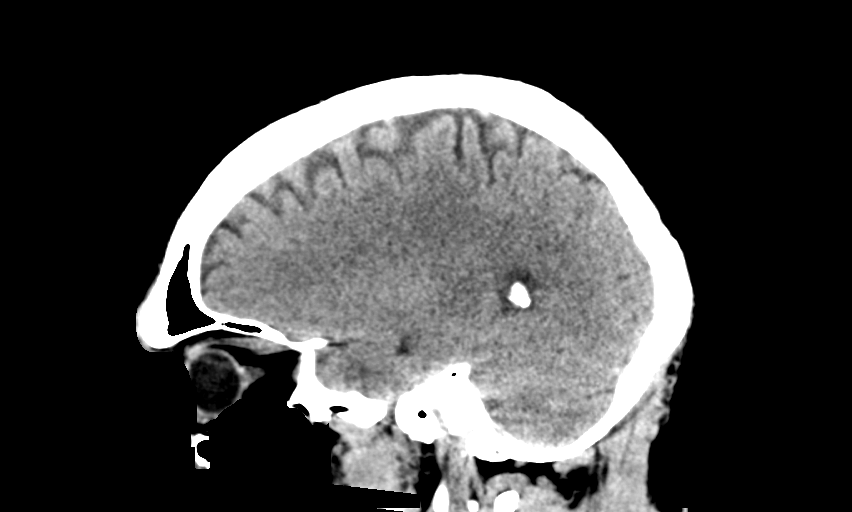
[im 32/63  brain]
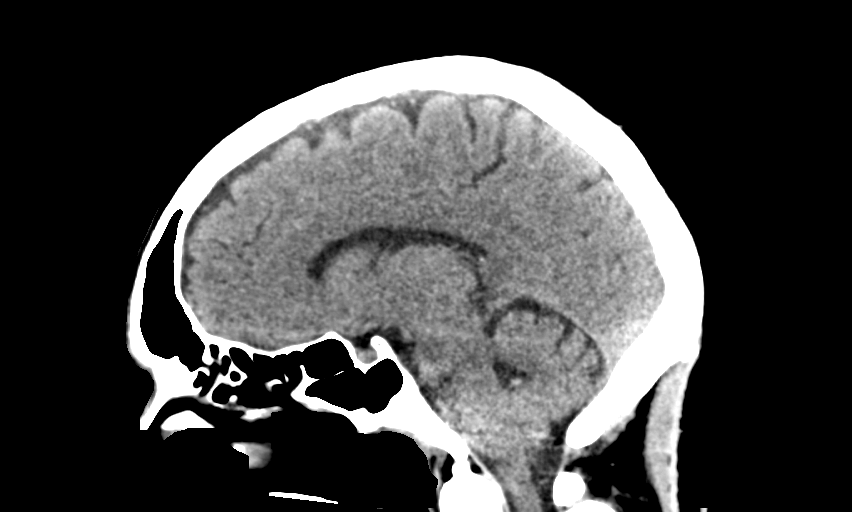
[im 42/63  brain]
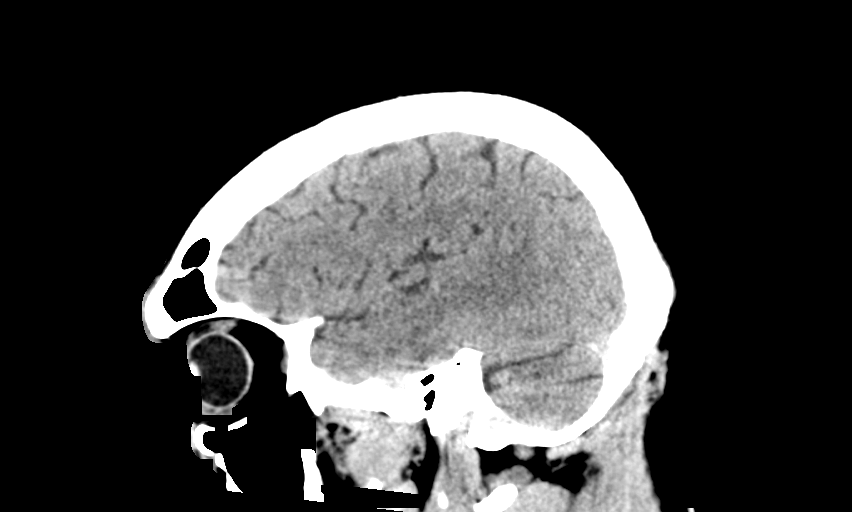

[15 of 47 positions shown; findings below may reference images not displayed]

FINDINGS: Brain: No intracranial hemorrhage, mass effect, or midline shift. No
hydrocephalus. The basilar cisterns are patent. No evidence of
territorial infarct or acute ischemia. No extra-axial or
intracranial fluid collection.

Vascular: No hyperdense vessel or unexpected calcification.

Skull: No fracture or focal lesion.

Sinuses/Orbits: Paranasal sinuses and mastoid air cells are clear.
The visualized orbits are unremarkable.

Other: None.
IMPRESSION: Negative noncontrast head CT.
